# Patient Record
Sex: Female | Born: 1990
Health system: Southern US, Community
[De-identification: ages and names within clinical notes are randomized; demographics above are authoritative.]

## PROBLEM LIST (undated history)

## (undated) DIAGNOSIS — J45909 Unspecified asthma, uncomplicated: Secondary | ICD-10-CM

## (undated) HISTORY — DX: Unspecified asthma, uncomplicated: J45.909

## (undated) HISTORY — PX: APPENDECTOMY: SHX54

---

## 2016-12-21 ENCOUNTER — Encounter: Payer: Self-pay | Admitting: Certified Nurse Midwife

## 2016-12-21 ENCOUNTER — Ambulatory Visit (INDEPENDENT_AMBULATORY_CARE_PROVIDER_SITE_OTHER): Payer: BLUE CROSS/BLUE SHIELD | Admitting: Certified Nurse Midwife

## 2016-12-21 VITALS — BP 122/92 | HR 98 | Ht 65.0 in | Wt 242.4 lb

## 2016-12-21 DIAGNOSIS — R635 Abnormal weight gain: Secondary | ICD-10-CM | POA: Diagnosis not present

## 2016-12-21 NOTE — Progress Notes (Signed)
GYN ENCOUNTER NOTE  Subjective:       Mallory English is a 26 y.o. G10P1001 female is here for gynecologic evaluation of the following issues:  1. Weight gain of 28 lbs since getting nexplanon in. She is contemplating having her nexplanon removed . She has been dieting " clean foods" and exercising 3-4 days a wk for 1hr to 1hr 15 min. For several months without weight loss. She occasional eats snacks of a smoothly consisting of fruit. She denies excess empty calories ( chips, cracker, sweets).  She denies hx of PCOS. She no cycle because of the nexplanon. She has been overall happy with the method except for weight gain since having it in place.     Gynecologic History Patient's last menstrual period was 10/30/2014. Contraception: Nexplanon Last Pap: 2017. Results were: normal per pt.  Last mammogram: N/A  Obstetric History OB History  Gravida Para Term Preterm AB Living  SAB TAB Ectopic Multiple Live Births          1    # Outcome Date GA Lbr Len/2nd Weight Sex Delivery Anes PTL Lv  1 Term 08/16/15 [redacted]w[redacted]d  8 lb 5 oz (3.771 kg) M Vag-Spont   LIV      Past Medical History:  Diagnosis Date  . Asthma     Past Surgical History:  Procedure Laterality Date  . APPENDECTOMY      No current outpatient prescriptions on file prior to visit.   No current facility-administered medications on file prior to visit.     No Known Allergies  Social History   Social History  . Marital status: Married    Spouse name: N/A  . Number of children: N/A  . Years of education: N/A   Occupational History  . Not on file.   Social History Main Topics  . Smoking status: Never Smoker  . Smokeless tobacco: Never Used  . Alcohol use Yes     Comment: Rarely   . Drug use: No  . Sexual activity: Yes    Birth control/ protection: Implant   Other Topics Concern  . Not on file   Social History Narrative  . No narrative on file    Family History  Problem Relation Age of Onset  .  Heart defect Mother     The following portions of the patient's history were reviewed and updated as appropriate: allergies, current medications, past family history, past medical history, past social history, past surgical history and problem list.  Review of Systems Review of Systems - Negative except as mentioned in HPI Review of Systems - General ROS: negative for - chills, fatigue, fever, hot flashes, malaise or night sweats Hematological and Lymphatic ROS: negative for - bleeding problems or swollen lymph nodes Gastrointestinal ROS: negative for - abdominal pain, blood in stools, change in bowel habits and nausea/vomiting Musculoskeletal ROS: negative for - joint pain, muscle pain or muscular weakness Genito-Urinary ROS: negative for - change in menstrual cycle, dysmenorrhea, dyspareunia, dysuria, genital discharge, genital ulcers, hematuria, incontinence, irregular/heavy menses, nocturia or pelvic pain  Objective:   BP (!) 122/92 (BP Location: Left Arm, Patient Position: Sitting, Cuff Size: Large)   Pulse 98   Ht  (1.651 m)   Wt 242 lb 6.4 oz (110 kg)   LMP 10/30/2014   Breastfeeding? No   BMI 40.34 kg/m  CONSTITUTIONAL: Well-developed, well-nourished female in no acute distress.  HENT:  Normocephalic, atraumatic.  NECK: Normal  range of motion, supple SKIN: Skin is warm and dry. No rash noted. Not diaphoretic. No erythema. No pallor. NEUROLGIC: Alert and oriented to person, place, and time.  PSYCHIATRIC: Normal mood and affect. Normal behavior. Normal judgment and thought content. CARDIOVASCULAR:Not Examined RESPIRATORY: Not Examined BREASTS: Not Examined ABDOMEN: Soft, non distended; Non tender.  No Organomegaly. PELVIC: not indicated MUSCULOSKELETAL: Normal range of motion. No tenderness.  No cyanosis, clubbing, or edema.   Assessment:   1. Weight gain  - TSH     Plan:   Will follow up with lab result, schedule nexplanon removal at earliest convince.  Talked about use of medications to assist with weight loss.   I attest that more than 50% of this visit was review of history, discussion of birth control option, discussion of her diet & exercise plan and discussion of medications for weight loss. We also discussed the removal procedure for the nexplanon.    Doreene BurkeAnnie Verita Kuroda, CNM

## 2016-12-21 NOTE — Patient Instructions (Signed)

## 2016-12-22 LAB — TSH: TSH: 1.45 u[IU]/mL (ref 0.450–4.500)

## 2016-12-24 ENCOUNTER — Telehealth: Payer: Self-pay | Admitting: Certified Nurse Midwife

## 2016-12-24 NOTE — Telephone Encounter (Signed)
Called to review labs, no answer. Left message.  Doreene BurkeAnnie Marilynn Ekstein, CNM

## 2016-12-24 NOTE — Telephone Encounter (Signed)
Patient called and stated that she missed a call from Doreene BurkeAnnie English, The patient would like a call back. No other information was disclosed. Please advise.

## 2016-12-24 NOTE — Telephone Encounter (Signed)
Reviewed TSH results with Mallory English. Answered her questions.   Mallory BurkeAnnie Javeion English, CNM

## 2016-12-24 NOTE — Telephone Encounter (Signed)
Patient called stating that she just missed a call from [facility] in regards to her Results. The patient would like a call back when Pattricia Bossnnie is available. No other information was disclosed. Please advise.

## 2016-12-26 ENCOUNTER — Ambulatory Visit (INDEPENDENT_AMBULATORY_CARE_PROVIDER_SITE_OTHER): Payer: BLUE CROSS/BLUE SHIELD | Admitting: Certified Nurse Midwife

## 2016-12-26 ENCOUNTER — Encounter: Payer: Self-pay | Admitting: Certified Nurse Midwife

## 2016-12-26 VITALS — BP 102/80 | HR 70 | Wt 239.4 lb

## 2016-12-26 DIAGNOSIS — Z3046 Encounter for surveillance of implantable subdermal contraceptive: Secondary | ICD-10-CM | POA: Diagnosis not present

## 2016-12-26 NOTE — Progress Notes (Signed)
Sima MatasJulia Dimmer is a 26 y.o. year old 71P1001 Caucasian female here for Nexplanon removal.  She was given informed consent for removal of her Nexplanon. Her Nexplanon was placed 15 months ago. She would like it removed to to weight gain.   No LMP recorded. Patient has had an implant..    BP 102/80   Pulse 70   Wt 239 lb 6.4 oz (108.6 kg)   BMI 39.84 kg/m  No LMP recorded. Patient has had an implant. No results found for this or any previous visit (from the past 24 hour(s)).   Appropriate time out taken. Nexplanon site identified.  Area prepped in usual sterile fashon. Two cc's of 2% lidocaine was used to anesthetize the area. A small stab incision was made right beside the implant on the distal portion.  The Nexplanon rod was grasped using hemostats and removed intact without difficulty.  Steri-strips and a pressure bandage was applied.  There was less than 3 cc blood loss. There were no complications.  The patient tolerated the procedure well.  She was instructed to keep the area clean and dry, remove pressure bandage in 24 hours, and keep insertion site covered with the steri-strips for 3-5 days.  She will use condoms for birth control. Follow-up PRN problems.  Doreene BurkeAnnie Kem Parcher, CNM

## 2016-12-26 NOTE — Patient Instructions (Signed)
Nexplanon Instructions After Removal  Keep bandage clean and dry for 24 hours  May use ice/Tylenol/Ibuprofen for soreness or pain  If you develop fever, drainage or increased warmth from incision site-contact office immediately   

## 2017-03-11 ENCOUNTER — Other Ambulatory Visit: Payer: Self-pay

## 2017-03-11 ENCOUNTER — Emergency Department
Admission: EM | Admit: 2017-03-11 | Discharge: 2017-03-11 | Disposition: A | Discharge status: Home / Self Care | Payer: BLUE CROSS/BLUE SHIELD | Attending: Emergency Medicine | Admitting: Emergency Medicine

## 2017-03-11 ENCOUNTER — Encounter: Payer: Self-pay | Admitting: Emergency Medicine

## 2017-03-11 DIAGNOSIS — K0889 Other specified disorders of teeth and supporting structures: Secondary | ICD-10-CM

## 2017-03-11 DIAGNOSIS — Z79899 Other long term (current) drug therapy: Secondary | ICD-10-CM | POA: Insufficient documentation

## 2017-03-11 DIAGNOSIS — K047 Periapical abscess without sinus: Secondary | ICD-10-CM | POA: Diagnosis not present

## 2017-03-11 DIAGNOSIS — K029 Dental caries, unspecified: Secondary | ICD-10-CM | POA: Insufficient documentation

## 2017-03-11 DIAGNOSIS — J45909 Unspecified asthma, uncomplicated: Secondary | ICD-10-CM | POA: Diagnosis not present

## 2017-03-11 MED ORDER — AMOXICILLIN 500 MG PO CAPS
500.0000 mg | ORAL_CAPSULE | Freq: Once | ORAL | Status: AC
  Administered 2017-03-11: 500 mg via ORAL
  Filled 2017-03-11: qty 1

## 2017-03-11 MED ORDER — HYDROCODONE-ACETAMINOPHEN 5-325 MG PO TABS: 1.0000 | ORAL_TABLET | Freq: Four times a day (QID) | ORAL | 0 refills | Status: DC | PRN

## 2017-03-11 MED ORDER — AMOXICILLIN 500 MG PO CAPS: 500.0000 mg | ORAL_CAPSULE | Freq: Three times a day (TID) | ORAL | 0 refills | Status: DC

## 2017-03-11 MED ORDER — HYDROCODONE-ACETAMINOPHEN 5-325 MG PO TABS
1.0000 | ORAL_TABLET | Freq: Once | ORAL | Status: AC
  Administered 2017-03-11: 1 via ORAL
  Filled 2017-03-11: qty 1

## 2017-03-11 MED ORDER — IBUPROFEN 800 MG PO TABS: 800.0000 mg | ORAL_TABLET | Freq: Three times a day (TID) | ORAL | 0 refills | Status: DC | PRN

## 2017-03-11 MED ORDER — LIDOCAINE VISCOUS 2 % MT SOLN
15.0000 mL | Freq: Once | OROMUCOSAL | Status: AC
  Administered 2017-03-11: 15 mL via OROMUCOSAL
  Filled 2017-03-11: qty 15

## 2017-03-11 NOTE — Discharge Instructions (Signed)
1.  Take antibiotic as prescribed (amoxicillin 500 mg 3 times daily x7 days). °2.  You may take pain medicines as needed (Motrin/Norco #15). °3.  Return to the ER for worsening symptoms, persistent vomiting, difficulty breathing or other concerns. °

## 2017-03-11 NOTE — ED Provider Notes (Signed)
[facility] Emergency Department Provider Note   ____________________________________________   First MD Initiated Contact with Patient 03/11/17 0533     (approximate)  I have reviewed the triage vital signs and the nursing notes.   HISTORY  Chief Complaint Dental Pain    HPI Mallory English is a 26 y.o. female who presents to the ED from home with a chief complaint of toothache.  Patient states "I have bad teeth", having intermittent pain to her left side for the past month.  Awoke around 1 AM this morning with pain to her left lower jaw.  Denies associated swelling, fever, chills, abdominal pain, nausea or vomiting.  Denies recent trauma.  Took ibuprofen without relief of symptoms.   Past Medical History:  Diagnosis Date  . Asthma     There are no active problems to display for this patient.   Past Surgical History:  Procedure Laterality Date  . APPENDECTOMY      Prior to Admission medications   Medication Sig Start Date End Date Taking? Authorizing Provider  albuterol (PROVENTIL HFA;VENTOLIN HFA) 108 (90 Base) MCG/ACT inhaler Inhale 2 puffs into the lungs every 4 (four) hours as needed for wheezing or shortness of breath.   Yes [provider]  amoxicillin (AMOXIL) 500 MG capsule Take 1 capsule (500 mg total) by mouth 3 (three) times daily. 03/11/17   Irean HongSung, Liem Copenhaver J, MD  HYDROcodone-acetaminophen (NORCO) 5-325 MG tablet Take 1 tablet by mouth every 6 (six) hours as needed for moderate pain. 03/11/17   Irean HongSung, Edita Weyenberg J, MD  ibuprofen (ADVIL,MOTRIN) 800 MG tablet Take 1 tablet (800 mg total) by mouth every 8 (eight) hours as needed for moderate pain. 03/11/17   Irean HongSung, Alixandrea Milleson J, MD    Allergies Patient has no known allergies.  Family History  Problem Relation Age of Onset  . Heart defect Mother     Social History Social History   Tobacco Use  . Smoking status: Never Smoker  . Smokeless tobacco: Never Used  Substance Use Topics  . Alcohol  use: Yes    Comment: Rarely   . Drug use: No    Review of Systems  Constitutional: No fever/chills. Eyes: No visual changes. ENT: Positive for toothache.  No sore throat. Cardiovascular: Denies chest pain. Respiratory: Denies shortness of breath. Gastrointestinal: No abdominal pain.  No nausea, no vomiting.  No diarrhea.  No constipation. Genitourinary: Negative for dysuria. Musculoskeletal: Negative for back pain. Skin: Negative for rash. Neurological: Negative for headaches, focal weakness or numbness.   ____________________________________________   PHYSICAL EXAM:  VITAL SIGNS: ED Triage Vitals  Enc Vitals Group     BP 03/11/17 0510 120/71     Pulse Rate 03/11/17 0510 87     Resp 03/11/17 0510 17     Temp 03/11/17 0510 98.4 F (36.9 C)     Temp Source 03/11/17 0510 Oral     SpO2 03/11/17 0510 99 %     Weight 03/11/17 0508 225 lb (102.1 kg)     Height 03/11/17 0508 5\' 5"  (1.651 m)     Head Circumference --      Peak Flow --      Pain Score 03/11/17 0508 7     Pain Loc --      Pain Edu? --      Excl. in GC? --     Constitutional: Alert and oriented. Well appearing and in no acute distress. Eyes: Conjunctivae are normal. PERRL. EOMI. Head: Atraumatic. Nose: No  congestion/rhinnorhea. Mouth/Throat: Widespread dental caries.  Left posterior molar with pre-existing chipped tooth.  Tender to palpation with tongue blade.  No intra-or extraoral swelling. Neck: No stridor.   Cardiovascular: Normal rate, regular rhythm. Grossly normal heart sounds.  Good peripheral circulation. Respiratory: Normal respiratory effort.  No retractions. Lungs CTAB. Gastrointestinal: Soft and nontender. No distention. No abdominal bruits. No CVA tenderness. Musculoskeletal: No lower extremity tenderness nor edema.  No joint effusions. Neurologic:  Normal speech and language. No gross focal neurologic deficits are appreciated. No gait instability. Skin:  Skin is warm, dry and intact. No rash  noted. Psychiatric: Mood and affect are normal. Speech and behavior are normal.  ____________________________________________   LABS (all labs ordered are listed, but only abnormal results are displayed)  Labs Reviewed - No data to display ____________________________________________  EKG  None ____________________________________________  RADIOLOGY  No results found.  ____________________________________________   PROCEDURES  Procedure(s) performed: None  Procedures  Critical Care performed: No  ____________________________________________   INITIAL IMPRESSION / ASSESSMENT AND PLAN / ED COURSE  As part of my medical decision making, I reviewed the following data within the electronic MEDICAL RECORD NUMBER Nursing notes reviewed and incorporated and Notes from prior ED visits.   26 year old female who presents with dentalgia secondary to dental infection.  Will start amoxicillin, analgesia and referred to dental clinic for follow-up.  Strict return precautions given.  Patient and spouse verbalized understanding and agree with plan of care.      ____________________________________________   FINAL CLINICAL IMPRESSION(S) / ED DIAGNOSES  Final diagnoses:  Pain, dental  Dental caries  Dental infection     ED Discharge Orders        Ordered    amoxicillin (AMOXIL) 500 MG capsule  3 times daily     03/11/17 0544    ibuprofen (ADVIL,MOTRIN) 800 MG tablet  Every 8 hours PRN     03/11/17 0544    HYDROcodone-acetaminophen (NORCO) 5-325 MG tablet  Every 6 hours PRN     03/11/17 0544       Note:  This document was prepared using Dragon voice recognition software and may include unintentional dictation errors.    Irean HongSung, Tylyn Derwin J, MD 03/11/17 [phone]

## 2017-03-11 NOTE — ED Triage Notes (Signed)
Pt says "I have bad teeth"; woke around 1 am with pain to the left side of her mouth, unable to tell if it's top or bottom as the whole side hurts; took ibuprofen with no relief;

## 2018-01-06 ENCOUNTER — Ambulatory Visit: Payer: BLUE CROSS/BLUE SHIELD | Admitting: Dietician

## 2018-01-13 ENCOUNTER — Encounter: Payer: Self-pay | Admitting: Dietician

## 2018-01-13 NOTE — Progress Notes (Signed)
Have not heard back from patient to reschedule her missed appointment from 01/06/18. Sent letter to referring provider.

## 2018-02-13 DIAGNOSIS — F419 Anxiety disorder, unspecified: Secondary | ICD-10-CM | POA: Insufficient documentation

## 2018-02-13 DIAGNOSIS — J4520 Mild intermittent asthma, uncomplicated: Secondary | ICD-10-CM | POA: Insufficient documentation

## 2018-02-13 DIAGNOSIS — E66813 Obesity, class 3: Secondary | ICD-10-CM | POA: Insufficient documentation

## 2018-02-24 ENCOUNTER — Encounter: Payer: BLUE CROSS/BLUE SHIELD | Admitting: Certified Nurse Midwife

## 2018-10-13 ENCOUNTER — Telehealth: Payer: Self-pay

## 2018-10-13 NOTE — Telephone Encounter (Signed)
Coronavirus (COVID-19) Are you at risk?  Are you at risk for the Coronavirus (COVID-19)?  To be considered HIGH RISK for Coronavirus (COVID-19), you have to meet the following criteria:  . Traveled to [place], [place], [place], [place] or [place]; or in the [place] to [place], [place], [place], or [place]; and have fever, cough, and shortness of breath within the last 2 weeks of travel OR . Been in close contact with a person diagnosed with COVID-19 within the last 2 weeks and have fever, cough, and shortness of breath . IF YOU DO NOT MEET THESE CRITERIA, YOU ARE CONSIDERED LOW RISK FOR COVID-19.  What to do if you are HIGH RISK for COVID-19?  . If you are having a medical emergency, call 911. . Seek medical care right away. Before you go to a doctor's office, urgent care or emergency department, call ahead and tell them about your recent travel, contact with someone diagnosed with COVID-19, and your symptoms. You should receive instructions from your physician's office regarding next steps of care.  . When you arrive at healthcare provider, tell the healthcare staff immediately you have returned from visiting [place], [place], [place], [place] or [place]; or traveled in the [place] to [place], [place], [place], or [place]; in the last two weeks or you have been in close contact with a person diagnosed with COVID-19 in the last 2 weeks.   . Tell the health care staff about your symptoms: fever, cough and shortness of breath. . After you have been seen by a medical provider, you will be either: o Tested for (COVID-19) and discharged home on quarantine except to seek medical care if symptoms worsen, and asked to  - Stay home and avoid contact with others until you get your results (4-5 days)  - Avoid travel on public transportation if possible (such as bus, train, or airplane) or o Sent to the Emergency Department by EMS for evaluation, COVID-19 testing, and possible  admission depending on your condition and test results.  What to do if you are LOW RISK for COVID-19?  Reduce your risk of any infection by using the same precautions used for avoiding the common cold or flu:  . Wash your hands often with soap and warm water for at least 20 seconds.  If soap and water are not readily available, use an alcohol-based hand sanitizer with at least 60% alcohol.  . If coughing or sneezing, cover your mouth and nose by coughing or sneezing into the elbow areas of your shirt or coat, into a tissue or into your sleeve (not your hands). . Avoid shaking hands with others and consider head nods or verbal greetings only. . Avoid touching your eyes, nose, or mouth with unwashed hands.  . Avoid close contact with people who are Reily Treloar. . Avoid places or events with large numbers of people in one location, like concerts or sporting events. . Carefully consider travel plans you have or are making. . If you are planning any travel outside or inside the [place], visit the CDC's Travelers' Health webpage for the latest health notices. . If you have some symptoms but not all symptoms, continue to monitor at home and seek medical attention if your symptoms worsen. . If you are having a medical emergency, call 911.  10/13/18 SCREENING NEG SLS ADDITIONAL HEALTHCARE OPTIONS FOR PATIENTS  [facility] / e-Visit: [email or web address]         [facility]: [phone]    [facility]: [phone]                   [facility]: [phone]  

## 2018-10-14 ENCOUNTER — Encounter: Payer: Self-pay | Admitting: Certified Nurse Midwife

## 2018-10-14 ENCOUNTER — Ambulatory Visit (INDEPENDENT_AMBULATORY_CARE_PROVIDER_SITE_OTHER): Payer: 59 | Admitting: Certified Nurse Midwife

## 2018-10-14 ENCOUNTER — Other Ambulatory Visit: Payer: Self-pay

## 2018-10-14 VITALS — BP 103/73 | HR 96 | Ht 65.0 in | Wt 265.2 lb

## 2018-10-14 DIAGNOSIS — Z3201 Encounter for pregnancy test, result positive: Secondary | ICD-10-CM

## 2018-10-14 DIAGNOSIS — N912 Amenorrhea, unspecified: Secondary | ICD-10-CM

## 2018-10-14 LAB — POCT URINE PREGNANCY: Preg Test, Ur: POSITIVE — AB

## 2018-10-14 NOTE — Patient Instructions (Signed)

## 2018-10-14 NOTE — Progress Notes (Signed)
Subjective:    Mallory English is a 28 y.o. female who presents for evaluation of amenorrhea. She believes she could be pregnant. Pregnancy is desired. Sexual Activity: single partner, contraception: none. Current symptoms also include: fatigue and nausea. Last period was normal.   Patient's last menstrual period was 09/01/2018 (exact date). The following portions of the patient's history were reviewed and updated as appropriate: allergies, current medications, past family history, past medical history, past social history, past surgical history and problem list.  Review of Systems Pertinent items are noted in HPI.     Objective:    BP 103/73   Pulse 96   Ht 5\' 5"  (1.651 m)   Wt 265 lb 3 oz (120.3 kg)   LMP 09/01/2018 (Exact Date)   BMI 44.13 kg/m  General: alert, cooperative, appears stated age and no acute distress    Lab Review Urine HCG: positive    Assessment:    Absence of menstruation.     Plan:   Positive: EDC: 06/08/19 Briefly discussed pre-natal care options MD vs Midwifery care. Pt request to see Midwives.  Encouraged well-balanced diet, plenty of rest when needed, pre-natal vitamins daily and walking for exercise. Discussed self-help for nausea, avoiding OTC medications until consulting provider or pharmacist, other than Tylenol as needed, minimal caffeine (1-2 cups daily) and avoiding alcohol. She will schedule her dating u/s in 2 wks, her nurse visit @ 10 wks and her  initial NOB visit @ 12 wks.   Philip Aspen, CNM

## 2018-10-28 ENCOUNTER — Other Ambulatory Visit: Payer: 59

## 2018-10-29 ENCOUNTER — Other Ambulatory Visit: Payer: Self-pay

## 2018-10-29 ENCOUNTER — Ambulatory Visit (INDEPENDENT_AMBULATORY_CARE_PROVIDER_SITE_OTHER): Payer: 59

## 2018-10-29 DIAGNOSIS — Z3687 Encounter for antenatal screening for uncertain dates: Secondary | ICD-10-CM | POA: Diagnosis not present

## 2018-10-29 DIAGNOSIS — N912 Amenorrhea, unspecified: Secondary | ICD-10-CM

## 2018-11-10 ENCOUNTER — Other Ambulatory Visit: Payer: Self-pay

## 2018-11-10 ENCOUNTER — Ambulatory Visit: Payer: 59 | Admitting: Certified Nurse Midwife

## 2018-11-10 VITALS — BP 118/64 | HR 80 | Ht 65.0 in | Wt 262.0 lb

## 2018-11-10 DIAGNOSIS — Z113 Encounter for screening for infections with a predominantly sexual mode of transmission: Secondary | ICD-10-CM

## 2018-11-10 DIAGNOSIS — Z3481 Encounter for supervision of other normal pregnancy, first trimester: Secondary | ICD-10-CM

## 2018-11-10 DIAGNOSIS — Z0283 Encounter for blood-alcohol and blood-drug test: Secondary | ICD-10-CM

## 2018-11-10 DIAGNOSIS — R638 Other symptoms and signs concerning food and fluid intake: Secondary | ICD-10-CM

## 2018-11-10 LAB — OB RESULTS CONSOLE VARICELLA ZOSTER ANTIBODY, IGG: Varicella: IMMUNE

## 2018-11-10 NOTE — Progress Notes (Signed)
      Mallory English presents for NOB nurse intake visit. Pregnancy confirmation done at [facility],10/14/18, with Philip Aspen.  G2.  P1001.  LMPUnknown.  EDD3/1/20 by ultrasound.  [place]. Pregnancy education material explained and given.  2 cats in the home.  NOB labs ordered. BMI greater than 30. TSH/HbgA1c ordered. Sickle cell not order due to race. HIV and drug screen explained and ordered/declined. Genetic screening discussed. Genetic testing; Ordered/Declined/Unsure. Pt to discuss genetic testing with provider. PNV encouraged. Pt to follow up with provider in 2 weeks for NOB physical. [facility] financial policy and FMLA forms reviewed and signed. Pt verbal stated understanding.    BP 118/64   Pulse 80   Ht 5\' 5"  (1.651 m)   Wt 262 lb (118.8 kg)   LMP 09/01/2018 (Exact Date)   BMI 43.60 kg/m

## 2018-11-10 NOTE — Patient Instructions (Addendum)
WHAT OB PATIENTS CAN EXPECT   Confirmation of pregnancy and ultrasound ordered if medically indicated-[redacted] weeks gestation  New OB (NOB) intake with nurse and New OB (NOB) labs- [redacted] weeks gestation  New OB (NOB) physical examination with provider- 11/[redacted] weeks gestation  Flu vaccine-[redacted] weeks gestation  Anatomy scan-[redacted] weeks gestation  Glucose tolerance test, blood work to test for anemia, T-dap vaccine-[redacted] weeks gestation  Vaginal swabs/cultures-STD/Group B strep-[redacted] weeks gestation  Appointments every 4 weeks until 28 weeks  Every 2 weeks from 28 weeks until 36 weeks  Weekly visits from 36 weeks until delivery  Second Trimester of Pregnancy  The second trimester is from week 14 through week 27 (month 4 through 6). This is often the time in pregnancy that you feel your best. Often times, morning sickness has lessened or quit. You may have more energy, and you may get hungry more often. Your unborn baby is growing rapidly. At the end of the sixth month, he or she is about 9 inches long and weighs about 1 pounds. You will likely feel the baby move between 18 and 20 weeks of pregnancy. Follow these instructions at home: Medicines  Take over-the-counter and prescription medicines only as told by your doctor. Some medicines are safe and some medicines are not safe during pregnancy.  Take a prenatal vitamin that contains at least 600 micrograms (mcg) of folic acid.  If you have trouble pooping (constipation), take medicine that will make your stool soft (stool softener) if your doctor approves. Eating and drinking   Eat regular, healthy meals.  Avoid raw meat and uncooked cheese.  If you get low calcium from the food you eat, talk to your doctor about taking a daily calcium supplement.  Avoid foods that are high in fat and sugars, such as fried and sweet foods.  If you feel sick to your stomach (nauseous) or throw up (vomit): ? Eat 4 or 5 small meals a day instead of 3 large  meals. ? Try eating a few soda crackers. ? Drink liquids between meals instead of during meals.  To prevent constipation: ? Eat foods that are high in fiber, like fresh fruits and vegetables, whole grains, and beans. ? Drink enough fluids to keep your pee (urine) clear or pale yellow. Activity  Exercise only as told by your doctor. Stop exercising if you start to have cramps.  Do not exercise if it is too hot, too humid, or if you are in a place of great height (high altitude).  Avoid heavy lifting.  Wear low-heeled shoes. Sit and stand up straight.  You can continue to have sex unless your doctor tells you not to. Relieving pain and discomfort  Wear a good support bra if your breasts are tender.  Take warm water baths (sitz baths) to soothe pain or discomfort caused by hemorrhoids. Use hemorrhoid cream if your doctor approves.  Rest with your legs raised if you have leg cramps or low back pain.  If you develop puffy, bulging veins (varicose veins) in your legs: ? Wear support hose or compression stockings as told by your doctor. ? Raise (elevate) your feet for 15 minutes, 3-4 times a day. ? Limit salt in your food. Prenatal care  Write down your questions. Take them to your prenatal visits.  Keep all your prenatal visits as told by your doctor. This is important. Safety  Wear your seat belt when driving.  Make a list of emergency phone numbers, including numbers for family, friends, the   hospital, and police and fire departments. General instructions  Ask your doctor about the right foods to eat or for help finding a counselor, if you need these services.  Ask your doctor about local prenatal classes. Begin classes before month 6 of your pregnancy.  Do not use hot tubs, steam rooms, or saunas.  Do not douche or use tampons or scented sanitary pads.  Do not cross your legs for long periods of time.  Visit your dentist if you have not done so. Use a soft toothbrush  to brush your teeth. Floss gently.  Avoid all smoking, herbs, and alcohol. Avoid drugs that are not approved by your doctor.  Do not use any products that contain nicotine or tobacco, such as cigarettes and e-cigarettes. If you need help quitting, ask your doctor.  Avoid cat litter boxes and soil used by cats. These carry germs that can cause birth defects in the baby and can cause a loss of your baby (miscarriage) or stillbirth. Contact a doctor if:  You have mild cramps or pressure in your lower belly.  You have pain when you pee (urinate).  You have bad smelling fluid coming from your vagina.  You continue to feel sick to your stomach (nauseous), throw up (vomit), or have watery poop (diarrhea).  You have a nagging pain in your belly area.  You feel dizzy. Get help right away if:  You have a fever.  You are leaking fluid from your vagina.  You have spotting or bleeding from your vagina.  You have severe belly cramping or pain.  You lose or gain weight rapidly.  You have trouble catching your breath and have chest pain.  You notice sudden or extreme puffiness (swelling) of your face, hands, ankles, feet, or legs.  You have not felt the baby move in over an hour.  You have severe headaches that do not go away when you take medicine.  You have trouble seeing. Summary  The second trimester is from week 14 through week 27 (months 4 through 6). This is often the time in pregnancy that you feel your best.  To take care of yourself and your unborn baby, you will need to eat healthy meals, take medicines only if your doctor tells you to do so, and do activities that are safe for you and your baby.  Call your doctor if you get sick or if you notice anything unusual about your pregnancy. Also, call your doctor if you need help with the right food to eat, or if you want to know what activities are safe for you. This information is not intended to replace advice given to you by  your health care provider. Make sure you discuss any questions you have with your health care provider. Document Released: 06/27/2009 Document Revised: 07/25/2018 Document Reviewed: 05/08/2016 Elsevier Patient Education  [place]. Morning Sickness  Morning sickness is when you feel sick to your stomach (nauseous) during pregnancy. You may feel sick to your stomach and throw up (vomit). You may feel sick in the morning, but you can feel this way at any time of day. Some women feel very sick to their stomach and cannot stop throwing up (hyperemesis gravidarum). Follow these instructions at home: Medicines  Take over-the-counter and prescription medicines only as told by your doctor. Do not take any medicines until you talk with your doctor about them first.  Taking multivitamins before getting pregnant can stop or lessen the harshness of morning sickness. Eating and  drinking  Eat dry toast or crackers before getting out of bed.  Eat 5 or 6 small meals a day.  Eat dry and bland foods like rice and baked potatoes.  Do not eat greasy, fatty, or spicy foods.  Have someone cook for you if the smell of food causes you to feel sick or throw up.  If you feel sick to your stomach after taking prenatal vitamins, take them at night or with a snack.  Eat protein when you need a snack. Nuts, yogurt, and cheese are good choices.  Drink fluids throughout the day.  Try ginger ale made with real ginger, ginger tea made from fresh grated ginger, or ginger candies. General instructions  Do not use any products that have nicotine or tobacco in them, such as cigarettes and e-cigarettes. If you need help quitting, ask your doctor.  Use an air purifier to keep the air in your house free of smells.  Get lots of fresh air.  Try to avoid smells that make you feel sick.  Try: ? Wearing a bracelet that is used for seasickness (acupressure wristband). ? Going to a doctor who puts thin needles  into certain body points (acupuncture) to improve how you feel. Contact a doctor if:  You need medicine to feel better.  You feel dizzy or light-headed.  You are losing weight. Get help right away if:  You feel very sick to your stomach and cannot stop throwing up.  You pass out (faint).  You have very bad pain in your belly. Summary  Morning sickness is when you feel sick to your stomach (nauseous) during pregnancy.  You may feel sick in the morning, but you can feel this way at any time of day.  Making some changes to what you eat may help your symptoms go away. This information is not intended to replace advice given to you by your health care provider. Make sure you discuss any questions you have with your health care provider. Document Released: 05/10/2004 Document Revised: 03/15/2017 Document Reviewed: 05/03/2016 Elsevier Patient Education  2020 [employer]. How a Baby Grows During Pregnancy  Pregnancy begins when a female's sperm enters a female's egg (fertilization). Fertilization usually happens in one of the tubes (fallopian tubes) that connect the ovaries to the womb (uterus). The fertilized egg moves down the fallopian tube to the uterus. Once it reaches the uterus, it implants into the lining of the uterus and begins to grow. For the first 10 weeks, the fertilized egg is called an embryo. After 10 weeks, it is called a fetus. As the fetus continues to grow, it receives oxygen and nutrients through tissue (placenta) that grows to support the developing baby. The placenta is the life support system for the baby. It provides oxygen and nutrition and removes waste. Learning as much as you can about your pregnancy and how your baby is developing can help you enjoy the experience. It can also make you aware of when there might be a problem and when to ask questions. How long does a typical pregnancy last? A pregnancy usually lasts 280 days, or about 40 weeks. Pregnancy is  divided into three periods of growth, also called trimesters:  First trimester: 0-12 weeks.  Second trimester: 13-27 weeks.  Third trimester: 28-40 weeks. The day when your baby is ready to be born (full term) is your estimated date of delivery. How does my baby develop month by month? First month  The fertilized egg attaches to the inside  of the uterus.  Some cells will form the placenta. Others will form the fetus.  The arms, legs, brain, spinal cord, lungs, and heart begin to develop.  At the end of the first month, the heart begins to beat. Second month  The bones, inner ear, eyelids, hands, and feet form.  The genitals develop.  By the end of 8 weeks, all major organs are developing. Third month  All of the internal organs are forming.  Teeth develop below the gums.  Bones and muscles begin to grow. The spine can flex.  The skin is transparent.  Fingernails and toenails begin to form.  Arms and legs continue to grow longer, and hands and feet develop.  The fetus is about 3 inches (7.6 cm) long. Fourth month  The placenta is completely formed.  The external sex organs, neck, outer ear, eyebrows, eyelids, and fingernails are formed.  The fetus can hear, swallow, and move its arms and legs.  The kidneys begin to produce urine.  The skin is covered with a white, waxy coating (vernix) and very fine hair (lanugo). Fifth month  The fetus moves around more and can be felt for the first time (quickening).  The fetus starts to sleep and wake up and may begin to suck its finger.  The nails grow to the end of the fingers.  The organ in the digestive system that makes bile (gallbladder) functions and helps to digest nutrients.  If your baby is a girl, eggs are present in her ovaries. If your baby is a boy, testicles start to move down into his scrotum. Sixth month  The lungs are formed.  The eyes open. The brain continues to develop.  Your baby has  fingerprints and toe prints. Your baby's hair grows thicker.  At the end of the second trimester, the fetus is about 9 inches (22.9 cm) long. Seventh month  The fetus kicks and stretches.  The eyes are developed enough to sense changes in light.  The hands can make a grasping motion.  The fetus responds to sound. Eighth month  All organs and body systems are fully developed and functioning.  Bones harden, and taste buds develop. The fetus may hiccup.  Certain areas of the brain are still developing. The skull remains soft. Ninth month  The fetus gains about  lb (0.23 kg) each week.  The lungs are fully developed.  Patterns of sleep develop.  The fetus's head typically moves into a head-down position (vertex) in the uterus to prepare for birth.  The fetus weighs 6-9 lb (2.72-4.08 kg) and is 19-20 inches (48.26-50.8 cm) long. What can I do to have a healthy pregnancy and help my baby develop? General instructions  Take prenatal vitamins as directed by your health care provider. These include vitamins such as folic acid, iron, calcium, and vitamin D. They are important for healthy development.  Take medicines only as directed by your health care provider. Read labels and ask a pharmacist or your health care provider whether over-the-counter medicines, supplements, and prescription drugs are safe to take during pregnancy.  Keep all follow-up visits as directed by your health care provider. This is important. Follow-up visits include prenatal care and screening tests. How do I know if my baby is developing well? At each prenatal visit, your health care provider will do several different tests to check on your health and keep track of your baby's development. These include:  Fundal height and position. ? Your health care provider will  measure your growing belly from your pubic bone to the top of the uterus using a tape measure. ? Your health care provider will also feel your  belly to determine your baby's position.  Heartbeat. ? An ultrasound in the first trimester can confirm pregnancy and show a heartbeat, depending on how far along you are. ? Your health care provider will check your baby's heart rate at every prenatal visit.  Second trimester ultrasound. ? This ultrasound checks your baby's development. It also may show your baby's gender. What should I do if I have concerns about my baby's development? Always talk with your health care provider about any concerns that you may have about your pregnancy and your baby. Summary  A pregnancy usually lasts 280 days, or about 40 weeks. Pregnancy is divided into three periods of growth, also called trimesters.  Your health care provider will monitor your baby's growth and development throughout your pregnancy.  Follow your health care provider's recommendations about taking prenatal vitamins and medicines during your pregnancy.  Talk with your health care provider if you have any concerns about your pregnancy or your developing baby. This information is not intended to replace advice given to you by your health care provider. Make sure you discuss any questions you have with your health care provider. Document Released: 09/19/2007 Document Revised: 07/24/2018 Document Reviewed: 02/13/2017 Elsevier Patient Education  2020 [place] of Pregnancy  The first trimester of pregnancy is from week 1 until the end of week 13 (months 1 through 3). During this time, your baby will begin to develop inside you. At 6-8 weeks, the eyes and face are formed, and the heartbeat can be seen on ultrasound. At the end of 12 weeks, all the baby's organs are formed. Prenatal care is all the medical care you receive before the birth of your baby. Make sure you get good prenatal care and follow all of your doctor's instructions. Follow these instructions at home: Medicines  Take over-the-counter and prescription  medicines only as told by your doctor. Some medicines are safe and some medicines are not safe during pregnancy.  Take a prenatal vitamin that contains at least 600 micrograms (mcg) of folic acid.  If you have trouble pooping (constipation), take medicine that will make your stool soft (stool softener) if your doctor approves. Eating and drinking   Eat regular, healthy meals.  Your doctor will tell you the amount of weight gain that is right for you.  Avoid raw meat and uncooked cheese.  If you feel sick to your stomach (nauseous) or throw up (vomit): ? Eat 4 or 5 small meals a day instead of 3 large meals. ? Try eating a few soda crackers. ? Drink liquids between meals instead of during meals.  To prevent constipation: ? Eat foods that are high in fiber, like fresh fruits and vegetables, whole grains, and beans. ? Drink enough fluids to keep your pee (urine) clear or pale yellow. Activity  Exercise only as told by your doctor. Stop exercising if you have cramps or pain in your lower belly (abdomen) or low back.  Do not exercise if it is too hot, too humid, or if you are in a place of great height (high altitude).  Try to avoid standing for long periods of time. Move your legs often if you must stand in one place for a long time.  Avoid heavy lifting.  Wear low-heeled shoes. Sit and stand up straight.  You can have sex  unless your doctor tells you not to. Relieving pain and discomfort  Wear a good support bra if your breasts are sore.  Take warm water baths (sitz baths) to soothe pain or discomfort caused by hemorrhoids. Use hemorrhoid cream if your doctor says it is okay.  Rest with your legs raised if you have leg cramps or low back pain.  If you have puffy, bulging veins (varicose veins) in your legs: ? Wear support hose or compression stockings as told by your doctor. ? Raise (elevate) your feet for 15 minutes, 3-4 times a day. ? Limit salt in your food. Prenatal  care  Schedule your prenatal visits by the twelfth week of pregnancy.  Write down your questions. Take them to your prenatal visits.  Keep all your prenatal visits as told by your doctor. This is important. Safety  Wear your seat belt at all times when driving.  Make a list of emergency phone numbers. The list should include numbers for family, friends, the hospital, and police and fire departments. General instructions  Ask your doctor for a referral to a local prenatal class. Begin classes no later than at the start of month 6 of your pregnancy.  Ask for help if you need counseling or if you need help with nutrition. Your doctor can give you advice or tell you where to go for help.  Do not use hot tubs, steam rooms, or saunas.  Do not douche or use tampons or scented sanitary pads.  Do not cross your legs for long periods of time.  Avoid all herbs and alcohol. Avoid drugs that are not approved by your doctor.  Do not use any tobacco products, including cigarettes, chewing tobacco, and electronic cigarettes. If you need help quitting, ask your doctor. You may get counseling or other support to help you quit.  Avoid cat litter boxes and soil used by cats. These carry germs that can cause birth defects in the baby and can cause a loss of your baby (miscarriage) or stillbirth.  Visit your dentist. At home, brush your teeth with a soft toothbrush. Be gentle when you floss. Contact a doctor if:  You are dizzy.  You have mild cramps or pressure in your lower belly.  You have a nagging pain in your belly area.  You continue to feel sick to your stomach, you throw up, or you have watery poop (diarrhea).  You have a bad smelling fluid coming from your vagina.  You have pain when you pee (urinate).  You have increased puffiness (swelling) in your face, hands, legs, or ankles. Get help right away if:  You have a fever.  You are leaking fluid from your vagina.  You have  spotting or bleeding from your vagina.  You have very bad belly cramping or pain.  You gain or lose weight rapidly.  You throw up blood. It may look like coffee grounds.  You are around people who have [place] measles, fifth disease, or chickenpox.  You have a very bad headache.  You have shortness of breath.  You have any kind of trauma, such as from a fall or a car accident. Summary  The first trimester of pregnancy is from week 1 until the end of week 13 (months 1 through 3).  To take care of yourself and your unborn baby, you will need to eat healthy meals, take medicines only if your doctor tells you to do so, and do activities that are safe for you and your baby.  Keep all follow-up visits as told by your doctor. This is important as your doctor will have to ensure that your baby is healthy and growing well. This information is not intended to replace advice given to you by your health care provider. Make sure you discuss any questions you have with your health care provider. Document Released: 09/19/2007 Document Revised: 07/24/2018 Document Reviewed: 04/10/2016 Elsevier Patient Education  2020 [employer]. Commonly Asked Questions During Pregnancy  Cats: A parasite can be excreted in cat feces.  To avoid exposure you need to have another person empty the little box.  If you must empty the litter box you will need to wear gloves.  Wash your hands after handling your cat.  This parasite can also be found in raw or undercooked meat so this should also be avoided.  Colds, Sore Throats, Flu: Please check your medication sheet to see what you can take for symptoms.  If your symptoms are unrelieved by these medications please call the office.  Dental Work: Most any dental work [employer] recommends is permitted.  X-rays should only be taken during the first trimester if absolutely necessary.  Your abdomen should be shielded with a lead apron during all x-rays.  Please notify your  provider prior to receiving any x-rays.  Novocaine is fine; gas is not recommended.  If your dentist requires a note from [place] prior to dental work please call the office and we will provide one for you.  Exercise: Exercise is an important part of staying healthy during your pregnancy.  You may continue most exercises you were accustomed to prior to pregnancy.  Later in your pregnancy you will most likely notice you have difficulty with activities requiring balance like riding a bicycle.  It is important that you listen to your body and avoid activities that put you at a higher risk of falling.  Adequate rest and staying well hydrated are a must!  If you have questions about the safety of specific activities ask your provider.    Exposure to Children with illness: Try to avoid obvious exposure; report any symptoms to [place] when noted,  If you have chicken pos, red measles or mumps, you should be immune to these diseases.   Please do not take any vaccines while pregnant unless you have checked with your OB provider.  Fetal Movement: After 28 weeks we recommend you do "kick counts" twice daily.  Lie or sit down in a calm quiet environment and count your baby movements "kicks".  You should feel your baby at least 10 times per hour.  If you have not felt 10 kicks within the first hour get up, walk around and have something sweet to eat or drink then repeat for an additional hour.  If count remains less than 10 per hour notify your provider.  Fumigating: Follow your pest control agent's advice as to how long to stay out of your home.  Ventilate the area well before re-entering.  Hemorrhoids:   Most over-the-counter preparations can be used during pregnancy.  Check your medication to see what is safe to use.  It is important to use a stool softener or fiber in your diet and to drink lots of liquids.  If hemorrhoids seem to be getting worse please call the office.   Hot Tubs:  Hot tubs Jacuzzis and saunas are not  recommended while pregnant.  These increase your internal body temperature and should be avoided.  Intercourse:  Sexual intercourse is safe during  pregnancy as long as you are comfortable, unless otherwise advised by your provider.  Spotting may occur after intercourse; report any bright red bleeding that is heavier than spotting.  Labor:  If you know that you are in labor, please go to the hospital.  If you are unsure, please call the office and let us help you decide what to do.  Lifting, straining, etc:  If your job requires heavy lifting or straining please check with your provider for any limitations.  Generally, you should not lift items heavier than that you can lift simply with your hands and arms (no back muscles)  Painting:  Paint fumes do not harm your pregnancy, but may make you ill and should be avoided if possible.  Latex or water based paints have less odor than oils.  Use adequate ventilation while painting.  Permanents & Hair Color:  Chemicals in hair dyes are not recommended as they cause increase hair dryness which can increase hair loss during pregnancy.  " Highlighting" and permanents are allowed.  Dye may be absorbed differently and permanents may not hold as well during pregnancy.  Sunbathing:  Use a sunscreen, as skin burns easily during pregnancy.  Drink plenty of fluids; avoid over heating.  Tanning Beds:  Because their possible side effects are still unknown, tanning beds are not recommended.  Ultrasound Scans:  Routine ultrasounds are performed at approximately 20 weeks.  You will be able to see your baby's general anatomy an if you would like to know the gender this can usually be determined as well.  If it is questionable when you conceived you may also receive an ultrasound early in your pregnancy for dating purposes.  Otherwise ultrasound exams are not routinely performed unless there is a medical necessity.  Although you can request a scan we ask that you pay for it  when conducted because insurance does not cover " patient request" scans.  Work: If your pregnancy proceeds without complications you may work until your due date, unless your physician or employer advises otherwise.  Round Ligament Pain/Pelvic Discomfort:  Sharp, shooting pains not associated with bleeding are fairly common, usually occurring in the second trimester of pregnancy.  They tend to be worse when standing up or when you remain standing for long periods of time.  These are the result of pressure of certain pelvic ligaments called "round ligaments".  Rest, Tylenol and heat seem to be the most effective relief.  As the womb and fetus grow, they rise out of the pelvis and the discomfort improves.  Please notify the office if your pain seems different than that described.  It may represent a more serious condition.  Common Medications Safe in Pregnancy  Acne:      Constipation:  Benzoyl Peroxide     Colace  Clindamycin      Dulcolax Suppository  Topica Erythromycin     Fibercon  Salicylic Acid      Metamucil         Miralax AVOID:        Senakot   Accutane    Cough:  Retin-A       Cough Drops  Tetracycline      Phenergan w/ Codeine if Rx  Minocycline      Robitussin (Plain & DM)  Antibiotics:     Crabs/Lice:  Ceclor       RID  Cephalosporins    AVOID:  E-Mycins      Kwell  Keflex  Macrobid/Macrodantin  Diarrhea:  Penicillin      Kao-Pectate  Zithromax      Imodium AD         PUSH FLUIDS AVOID:       Cipro     Fever:  Tetracycline      Tylenol (Regular or Extra  Minocycline       Strength)  Levaquin      Extra Strength-Do not          Exceed 8 tabs/24 hrs Caffeine:        '200mg'$ /day (equiv. To 1 cup of coffee or  approx. 3 12 oz sodas)         Gas: Cold/Hayfever:       Gas-X  Benadryl      Mylicon  Claritin       Phazyme  **Claritin-D        Chlor-Trimeton    Headaches:  Dimetapp      ASA-Free Excedrin  Drixoral-Non-Drowsy     Cold Compress  Mucinex  (Guaifenasin)     Tylenol (Regular or Extra  Sudafed/Sudafed-12 Hour     Strength)  **Sudafed PE Pseudoephedrine   Tylenol Cold & Sinus     Vicks Vapor Rub  Zyrtec  **AVOID if Problems With Blood Pressure         Heartburn: Avoid lying down for at least 1 hour after meals  Aciphex      Maalox     Rash:  Milk of Magnesia     Benadryl    Mylanta       1% Hydrocortisone Cream  Pepcid  Pepcid Complete   Sleep Aids:  Prevacid      Ambien   Prilosec       Benadryl  Rolaids       Chamomile Tea  Tums (Limit 4/day)     Unisom  Zantac       Tylenol PM         Warm milk-add vanilla or  Hemorrhoids:       Sugar for taste  Anusol/Anusol H.C.  (RX: Analapram 2.5%)  Sugar Substitutes:  Hydrocortisone OTC     Ok in moderation  Preparation H      Tucks        Vaseline lotion applied to tissue with wiping    Herpes:     Throat:  Acyclovir      Oragel  Famvir  Valtrex     Vaccines:         Flu Shot Leg Cramps:       *Gardasil  Benadryl      Hepatitis A         Hepatitis B Nasal Spray:       Pneumovax  Saline Nasal Spray     Polio Booster         Tetanus Nausea:       Tuberculosis test or PPD  Vitamin B6 25 mg TID   AVOID:    Dramamine      *Gardasil  Emetrol       Live Poliovirus  Ginger Root 250 mg QID    MMR (measles, mumps &  High Complex Carbs @ Bedtime    rebella)  Sea Bands-Accupressure    Varicella (Chickenpox)  Unisom 1/2 tab TID     *No known complications           If received before Pain:         Known pregnancy;   Darvocet  Resume series after  Lortab        Delivery  Percocet    Yeast:   Tramadol      Femstat  Tylenol 3      Gyne-lotrimin  Ultram       Monistat  Vicodin           MISC:         All Sunscreens           Hair Coloring/highlights          Insect Repellant's          (Including DEET)         Mystic Tans

## 2018-11-11 LAB — URINALYSIS, ROUTINE W REFLEX MICROSCOPIC
Bilirubin, UA: NEGATIVE
Glucose, UA: NEGATIVE
Ketones, UA: NEGATIVE
Leukocytes,UA: NEGATIVE
Nitrite, UA: NEGATIVE
RBC, UA: NEGATIVE
Specific Gravity, UA: 1.022 (ref 1.005–1.030)
Urobilinogen, Ur: 1 mg/dL (ref 0.2–1.0)
pH, UA: 8.5 — ABNORMAL HIGH (ref 5.0–7.5)

## 2018-11-11 LAB — DRUG PROFILE, UR, 9 DRUGS (LABCORP)
Amphetamines, Urine: NEGATIVE ng/mL
Barbiturate Quant, Ur: NEGATIVE ng/mL
Benzodiazepine Quant, Ur: NEGATIVE ng/mL
Cannabinoid Quant, Ur: NEGATIVE ng/mL
Cocaine (Metab.): NEGATIVE ng/mL
Methadone Screen, Urine: NEGATIVE ng/mL
Opiate Quant, Ur: NEGATIVE ng/mL
PCP Quant, Ur: NEGATIVE ng/mL
Propoxyphene: NEGATIVE ng/mL

## 2018-11-11 LAB — NICOTINE SCREEN, URINE: Cotinine Ql Scrn, Ur: NEGATIVE ng/mL

## 2018-11-12 LAB — TOXOPLASMA ANTIBODIES- IGG AND  IGM
Toxoplasma Antibody- IgM: <3 AU/mL (ref 0.0–7.9)
Toxoplasma IgG Ratio: <3 IU/mL (ref 0.0–7.1)

## 2018-11-12 LAB — ANTIBODY SCREEN: Antibody Screen: NEGATIVE

## 2018-11-12 LAB — TSH: TSH: 1.63 u[IU]/mL (ref 0.450–4.500)

## 2018-11-12 LAB — RUBELLA SCREEN: Rubella Antibodies, IGG: 4.5 index (ref >0.99)

## 2018-11-12 LAB — HIV ANTIBODY (ROUTINE TESTING W REFLEX): HIV Screen 4th Generation wRfx: NONREACTIVE

## 2018-11-12 LAB — URINE CULTURE, OB REFLEX

## 2018-11-12 LAB — ABO AND RH: Rh Factor: POSITIVE

## 2018-11-12 LAB — HEPATITIS B SURFACE ANTIGEN: Hepatitis B Surface Ag: NEGATIVE

## 2018-11-12 LAB — HEMOGLOBIN A1C
Est. average glucose Bld gHb Est-mCnc: 105 mg/dL
Hgb A1c MFr Bld: 5.3 % (ref 4.8–5.6)

## 2018-11-12 LAB — CULTURE, OB URINE

## 2018-11-12 LAB — VARICELLA ZOSTER ANTIBODY, IGG: Varicella zoster IgG: 1581 {index}

## 2018-11-12 LAB — RPR: RPR Ser Ql: NONREACTIVE

## 2018-11-14 LAB — GC/CHLAMYDIA PROBE AMP
Chlamydia trachomatis, NAA: NEGATIVE
Neisseria Gonorrhoeae by PCR: NEGATIVE

## 2018-11-25 ENCOUNTER — Encounter: Payer: 59 | Admitting: Certified Nurse Midwife

## 2018-12-01 ENCOUNTER — Telehealth: Payer: Self-pay

## 2018-12-01 NOTE — Telephone Encounter (Signed)
Coronavirus (COVID-19) Are you at risk?  Are you at risk for the Coronavirus (COVID-19)?  To be considered HIGH RISK for Coronavirus (COVID-19), you have to meet the following criteria:  . Traveled to [place], [place], [place], [place] or [place]; or in the [place] to [place], [place], [place], or [place]; and have fever, cough, and shortness of breath within the last 2 weeks of travel OR . Been in close contact with a person diagnosed with COVID-19 within the last 2 weeks and have fever, cough, and shortness of breath . IF YOU DO NOT MEET THESE CRITERIA, YOU ARE CONSIDERED LOW RISK FOR COVID-19.  What to do if you are HIGH RISK for COVID-19?  . If you are having a medical emergency, call 911. . Seek medical care right away. Before you go to a doctor's office, urgent care or emergency department, call ahead and tell them about your recent travel, contact with someone diagnosed with COVID-19, and your symptoms. You should receive instructions from your physician's office regarding next steps of care.  . When you arrive at healthcare provider, tell the healthcare staff immediately you have returned from visiting [place], [place], [place], [place] or [place]; or traveled in the [place] to [place], [place], [place], or [place]; in the last two weeks or you have been in close contact with a person diagnosed with COVID-19 in the last 2 weeks.   . Tell the health care staff about your symptoms: fever, cough and shortness of breath. . After you have been seen by a medical provider, you will be either: o Tested for (COVID-19) and discharged home on quarantine except to seek medical care if symptoms worsen, and asked to  - Stay home and avoid contact with others until you get your results (4-5 days)  - Avoid travel on public transportation if possible (such as bus, train, or airplane) or o Sent to the Emergency Department by EMS for evaluation, COVID-19 testing, and possible  admission depending on your condition and test results.  What to do if you are LOW RISK for COVID-19?  Reduce your risk of any infection by using the same precautions used for avoiding the common cold or flu:  . Wash your hands often with soap and warm water for at least 20 seconds.  If soap and water are not readily available, use an alcohol-based hand sanitizer with at least 60% alcohol.  . If coughing or sneezing, cover your mouth and nose by coughing or sneezing into the elbow areas of your shirt or coat, into a tissue or into your sleeve (not your hands). . Avoid shaking hands with others and consider head nods or verbal greetings only. . Avoid touching your eyes, nose, or mouth with unwashed hands.  . Avoid close contact with people who are Mallory English. . Avoid places or events with large numbers of people in one location, like concerts or sporting events. . Carefully consider travel plans you have or are making. . If you are planning any travel outside or inside the [place], visit the CDC's Travelers' Health webpage for the latest health notices. . If you have some symptoms but not all symptoms, continue to monitor at home and seek medical attention if your symptoms worsen. . If you are having a medical emergency, call 911.  12/01/18 SCREENING NEG SLS ADDITIONAL HEALTHCARE OPTIONS FOR PATIENTS  [facility] / e-Visit: [email or web address]         [facility]: [phone]    [facility]: [phone]                   [facility]: [phone]  

## 2018-12-02 ENCOUNTER — Other Ambulatory Visit (HOSPITAL_COMMUNITY)
Admission: RE | Admit: 2018-12-02 | Discharge: 2018-12-02 | Disposition: A | Discharge status: Home / Self Care | Payer: 59 | Source: Ambulatory Visit | Attending: Certified Nurse Midwife | Admitting: Certified Nurse Midwife

## 2018-12-02 ENCOUNTER — Other Ambulatory Visit: Payer: Self-pay

## 2018-12-02 ENCOUNTER — Encounter: Payer: Self-pay | Admitting: Certified Nurse Midwife

## 2018-12-02 ENCOUNTER — Telehealth: Payer: Self-pay | Admitting: Certified Nurse Midwife

## 2018-12-02 ENCOUNTER — Ambulatory Visit (INDEPENDENT_AMBULATORY_CARE_PROVIDER_SITE_OTHER): Payer: 59 | Admitting: Certified Nurse Midwife

## 2018-12-02 VITALS — BP 112/78 | HR 95 | Wt 258.3 lb

## 2018-12-02 DIAGNOSIS — Z124 Encounter for screening for malignant neoplasm of cervix: Secondary | ICD-10-CM | POA: Diagnosis present

## 2018-12-02 DIAGNOSIS — Z3481 Encounter for supervision of other normal pregnancy, first trimester: Secondary | ICD-10-CM

## 2018-12-02 DIAGNOSIS — O99211 Obesity complicating pregnancy, first trimester: Secondary | ICD-10-CM

## 2018-12-02 LAB — POCT URINALYSIS DIPSTICK OB
Bilirubin, UA: NEGATIVE
Blood, UA: NEGATIVE
Glucose, UA: NEGATIVE
Ketones, UA: NEGATIVE
Leukocytes, UA: NEGATIVE
Nitrite, UA: NEGATIVE
POC,PROTEIN,UA: NEGATIVE
Spec Grav, UA: 1.01 (ref 1.010–1.025)
Urobilinogen, UA: 0.2 E.U./dL
pH, UA: 5 (ref 5.0–8.0)

## 2018-12-02 NOTE — Patient Instructions (Signed)

## 2018-12-02 NOTE — Progress Notes (Signed)
NEW OB HISTORY AND PHYSICAL  SUBJECTIVE:       Mallory English is a 28 y.o. [id number] female, Patient's last menstrual period was 09/01/2018 (exact date)., Estimated Date of Delivery: 06/15/19, 6754w1d, presents today for establishment of Prenatal Care. She complains of nausea. She is taking vitamin B 6 and Unisom. Samples of Bonjesta given. Discussed using zofran if needed.   Gynecologic History Patient's last menstrual period was 09/01/2018 (exact date). Normal Contraception: none Last Pap: 2017 Results were: normal  Obstetric History OB History  Gravida Para Term Preterm AB Living  2 1 1     1   SAB TAB Ectopic Multiple Live Births          1    # Outcome Date GA Lbr Len/2nd Weight Sex Delivery Anes PTL Lv  2 Current           1 Term 08/16/15 4729w5d  8 lb 14 oz (4.026 kg) M Vag-Spont  N LIV    Past Medical History:  Diagnosis Date  . Asthma     Past Surgical History:  Procedure Laterality Date  . APPENDECTOMY      Current Outpatient Medications on File Prior to Visit  Medication Sig Dispense Refill  . albuterol (PROVENTIL HFA;VENTOLIN HFA) 108 (90 Base) MCG/ACT inhaler Inhale 2 puffs into the lungs every 4 (four) hours as needed for wheezing or shortness of breath.    . Prenatal Vit-Fe Fumarate-FA (PRENATAL MULTIVITAMIN) TABS tablet Take 1 tablet by mouth daily at 12 noon.     No current facility-administered medications on file prior to visit.     No Known Allergies  Social History   Socioeconomic History  . Marital status: Married    Spouse name: Not on file  . Number of children: Not on file  . Years of education: Not on file  . Highest education level: Not on file  Occupational History  . Not on file  Social Needs  . Financial resource strain: Not on file  . Food insecurity    Worry: Not on file    Inability: Not on file  . Transportation needs    Medical: Not on file    Non-medical: Not on file  Tobacco Use  . Smoking status: Never Smoker  . Smokeless  tobacco: Never Used  Substance and Sexual Activity  . Alcohol use: Not Currently    Comment: Rarely   . Drug use: No  . Sexual activity: Yes  Lifestyle  . Physical activity    Days per week: Not on file    Minutes per session: Not on file  . Stress: Not on file  Relationships  . Social Musicianconnections    Talks on phone: Not on file    Gets together: Not on file    Attends religious service: Not on file    Active member of club or organization: Not on file    Attends meetings of clubs or organizations: Not on file    Relationship status: Not on file  . Intimate partner violence    Fear of current or ex partner: Not on file    Emotionally abused: Not on file    Physically abused: Not on file    Forced sexual activity: Not on file  Other Topics Concern  . Not on file  Social History Narrative  . Not on file    Family History  Problem Relation Age of Onset  . Heart defect Mother     The following portions of the  patient's history were reviewed and updated as appropriate: allergies, current medications, past OB history, past medical history, past surgical history, past family history, past social history, and problem list.  Body mass index is 42.99 kg/m.    OBJECTIVE: Initial Physical Exam (New OB)  GENERAL APPEARANCE: alert, well appearing,obese, in no apparent distress, oriented to person, place and time, HEAD: normocephalic, atraumatic MOUTH: mucous membranes moist, pharynx normal without lesions THYROID: no thyromegaly or masses present BREASTS: no masses noted, no significant tenderness, no palpable axillary nodes, no skin changes. Inverted nipples, used shield last preg.   LUNGS: clear to auscultation, no wheezes, rales or rhonchi, symmetric air entry HEART: regular rate and rhythm, no murmurs ABDOMEN: soft, nontender, nondistended, no abnormal masses, no epigastric pain EXTREMITIES: no redness or tenderness in the calves or thighs SKIN: normal coloration and  turgor, no rashes LYMPH NODES: no adenopathy palpable NEUROLOGIC: alert, oriented, normal speech, no focal findings or movement disorder noted  PELVIC EXAM EXTERNAL GENITALIA: normal appearing vulva with no masses, tenderness or lesions VAGINA: no abnormal discharge or lesions CERVIX: no lesions or cervical motion tenderness, pap collected UTERUS: gravid ADNEXA: no masses palpable and nontender OB EXAM PELVIMETRY: appears adequate RECTUM: exam not indicated  ASSESSMENT: Normal pregnancy  PLAN: New OB counseling: The patient has been given an overview regarding routine prenatal care. Recommendations regarding diet, weight gain (11-15 lbs), and exercise in pregnancy were given. Prenatal testing, optional genetic testing, carrier screening and ultrasound use in pregnancy were reviewed. Panorama testing today. Discussed early glucose. Discussed risking out of Midwifery care if BMI of 50, discussed anesthesia consult for BMI greater than 45. Benefits of Breast Feeding were discussed. The patient is encouraged to consider nursing her baby post partum.   Mallory English, CNM

## 2018-12-02 NOTE — Telephone Encounter (Signed)
The patient was in the office for her appointment while checking out pt was confused and had a few questions about having an u/s and genetic testing. I advised pt that was not in disposition, but I will send a message back to nurse to confirm. Please advise.

## 2018-12-03 LAB — CBC
Hematocrit: 36.7 % (ref 34.0–46.6)
Hemoglobin: 11.9 g/dL (ref 11.1–15.9)
MCH: 28.3 pg (ref 26.6–33.0)
MCHC: 32.4 g/dL (ref 31.5–35.7)
MCV: 87 fL (ref 79–97)
Platelets: 342 10*3/uL (ref 150–450)
RBC: 4.21 x10E6/uL (ref 3.77–5.28)
RDW: 14 % (ref 11.7–15.4)
WBC: 8.4 10*3/uL (ref 3.4–10.8)

## 2018-12-04 LAB — CYTOLOGY - PAP: Diagnosis: NEGATIVE

## 2018-12-23 ENCOUNTER — Telehealth: Payer: Self-pay

## 2018-12-23 NOTE — Telephone Encounter (Signed)
mychart message sent

## 2018-12-30 ENCOUNTER — Other Ambulatory Visit: Payer: Self-pay

## 2018-12-30 ENCOUNTER — Other Ambulatory Visit: Payer: 59

## 2018-12-30 ENCOUNTER — Ambulatory Visit (INDEPENDENT_AMBULATORY_CARE_PROVIDER_SITE_OTHER): Payer: 59 | Admitting: Obstetrics and Gynecology

## 2018-12-30 VITALS — BP 107/80 | HR 94 | Wt 260.1 lb

## 2018-12-30 DIAGNOSIS — Z23 Encounter for immunization: Secondary | ICD-10-CM | POA: Diagnosis not present

## 2018-12-30 DIAGNOSIS — O99211 Obesity complicating pregnancy, first trimester: Secondary | ICD-10-CM

## 2018-12-30 DIAGNOSIS — Z3481 Encounter for supervision of other normal pregnancy, first trimester: Secondary | ICD-10-CM

## 2018-12-30 DIAGNOSIS — Z3492 Encounter for supervision of normal pregnancy, unspecified, second trimester: Secondary | ICD-10-CM

## 2018-12-30 LAB — POCT URINALYSIS DIPSTICK OB
Bilirubin, UA: NEGATIVE
Blood, UA: NEGATIVE
Glucose, UA: NEGATIVE
Ketones, UA: 40
Leukocytes, UA: NEGATIVE
Nitrite, UA: NEGATIVE
POC,PROTEIN,UA: NEGATIVE
Spec Grav, UA: 1.015 (ref 1.010–1.025)
Urobilinogen, UA: 0.2 E.U./dL
pH, UA: 7 (ref 5.0–8.0)

## 2018-12-30 NOTE — Progress Notes (Signed)
ROB & early glucola- Flu vaccine given, anatomy scan next visit. RSB video done. Still having some nausea. 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 Lives with spouse and 27 yo son.

## 2018-12-30 NOTE — Progress Notes (Signed)
ROB- glucola done, flu vaccine given, pt is doing well

## 2018-12-31 LAB — GLUCOSE TOLERANCE, 1 HOUR: Glucose, 1Hr PP: 101 mg/dL (ref 65–199)

## 2019-01-27 ENCOUNTER — Ambulatory Visit (INDEPENDENT_AMBULATORY_CARE_PROVIDER_SITE_OTHER): Payer: 59

## 2019-01-27 ENCOUNTER — Ambulatory Visit (INDEPENDENT_AMBULATORY_CARE_PROVIDER_SITE_OTHER): Payer: 59 | Admitting: Certified Nurse Midwife

## 2019-01-27 ENCOUNTER — Other Ambulatory Visit: Payer: Self-pay

## 2019-01-27 VITALS — BP 104/79 | HR 81 | Wt 260.0 lb

## 2019-01-27 DIAGNOSIS — Z3A20 20 weeks gestation of pregnancy: Secondary | ICD-10-CM

## 2019-01-27 DIAGNOSIS — Z3492 Encounter for supervision of normal pregnancy, unspecified, second trimester: Secondary | ICD-10-CM | POA: Diagnosis not present

## 2019-01-27 DIAGNOSIS — Z3689 Encounter for other specified antenatal screening: Secondary | ICD-10-CM

## 2019-01-27 LAB — POCT URINALYSIS DIPSTICK OB
Bilirubin, UA: NEGATIVE
Blood, UA: NEGATIVE
Glucose, UA: NEGATIVE
Ketones, UA: NEGATIVE
Leukocytes, UA: NEGATIVE
Nitrite, UA: NEGATIVE
POC,PROTEIN,UA: NEGATIVE
Spec Grav, UA: 1.015 (ref 1.010–1.025)
Urobilinogen, UA: 0.2 E.U./dL
pH, UA: 6.5 (ref 5.0–8.0)

## 2019-01-27 NOTE — Patient Instructions (Signed)
WHAT OB PATIENTS CAN EXPECT   Confirmation of pregnancy and ultrasound ordered if medically indicated-[redacted] weeks gestation  New OB (NOB) intake with nurse and New OB (NOB) labs- [redacted] weeks gestation  New OB (NOB) physical examination with provider- 11/[redacted] weeks gestation  Flu vaccine-[redacted] weeks gestation  Anatomy scan-[redacted] weeks gestation  Glucose tolerance test, blood work to test for anemia, T-dap vaccine-[redacted] weeks gestation  Vaginal swabs/cultures-STD/Group B strep-[redacted] weeks gestation  Appointments every 4 weeks until 28 weeks  Every 2 weeks from 28 weeks until 36 weeks  Weekly visits from 36 weeks until delivery  Back Pain in Pregnancy Back pain during pregnancy is common. Back pain may be caused by several factors that are related to changes during your pregnancy. Follow these instructions at home: Managing pain, stiffness, and swelling      If directed, for sudden (acute) back pain, put ice on the painful area. ? Put ice in a plastic bag. ? Place a towel between your skin and the bag. ? Leave the ice on for 20 minutes, 2-3 times per day.  If directed, apply heat to the affected area before you exercise. Use the heat source that your health care provider recommends, such as a moist heat pack or a heating pad. ? Place a towel between your skin and the heat source. ? Leave the heat on for 20-30 minutes. ? Remove the heat if your skin turns bright red. This is especially important if you are unable to feel pain, heat, or cold. You may have a greater risk of getting burned.  If directed, massage the affected area. Activity  Exercise as told by your health care provider. Gentle exercise is the best way to prevent or manage back pain.  Listen to your body when lifting. If lifting hurts, ask for help or bend your knees. This uses your leg muscles instead of your back muscles.  Squat down when picking up something from the floor. Do not bend over.  Only use bed rest for short  periods as told by your health care provider. Bed rest should only be used for the most severe episodes of back pain. Standing, sitting, and lying down  Do not stand in one place for long periods of time.  Use good posture when sitting. Make sure your head rests over your shoulders and is not hanging forward. Use a pillow on your lower back if necessary.  Try sleeping on your side, preferably the left side, with a pregnancy support pillow or 1-2 regular pillows between your legs. ? If you have back pain after a night's rest, your bed may be too soft. ? A firm mattress may provide more support for your back during pregnancy. General instructions  Do not wear high heels.  Eat a healthy diet. Try to gain weight within your health care provider's recommendations.  Use a maternity girdle, elastic sling, or back brace as told by your health care provider.  Take over-the-counter and prescription medicines only as told by your health care provider.  Work with a physical therapist or massage therapist to find ways to manage back pain. Acupuncture or massage therapy may be helpful.  Keep all follow-up visits as told by your health care provider. This is important. Contact a health care provider if:  Your back pain interferes with your daily activities.  You have increasing pain in other parts of your body. Get help right away if:  You develop numbness, tingling, weakness, or problems with the use of   your arms or legs.  You develop severe back pain that is not controlled with medicine.  You have a change in bowel or bladder control.  You develop shortness of breath, dizziness, or you faint.  You develop nausea, vomiting, or sweating.  You have back pain that is a rhythmic, cramping pain similar to labor pains. Labor pain is usually 1-2 minutes apart, lasts for about 1 minute, and involves a bearing down feeling or pressure in your pelvis.  You have back pain and your water breaks or  you have vaginal bleeding.  You have back pain or numbness that travels down your leg.  Your back pain developed after you fell.  You develop pain on one side of your back.  You see blood in your urine.  You develop skin blisters in the area of your back pain. Summary  Back pain may be caused by several factors that are related to changes during your pregnancy.  Follow instructions as told by your health care provider for managing pain, stiffness, and swelling.  Exercise as told by your health care provider. Gentle exercise is the best way to prevent or manage back pain.  Take over-the-counter and prescription medicines only as told by your health care provider.  Keep all follow-up visits as told by your health care provider. This is important. This information is not intended to replace advice given to you by your health care provider. Make sure you discuss any questions you have with your health care provider. Document Released: 07/11/2005 Document Revised: 07/22/2018 Document Reviewed: 09/18/2017 Elsevier Patient Education  2020 Elsevier Inc. Round Ligament Pain  The round ligament is a cord of muscle and tissue that helps support the uterus. It can become a source of pain during pregnancy if it becomes stretched or twisted as the baby grows. The pain usually begins in the second trimester (13-28 weeks) of pregnancy, and it can come and go until the baby is delivered. It is not a serious problem, and it does not cause harm to the baby. Round ligament pain is usually a short, sharp, and pinching pain, but it can also be a dull, lingering, and aching pain. The pain is felt in the lower side of the abdomen or in the groin. It usually starts deep in the groin and moves up to the outside of the hip area. The pain may occur when you:  Suddenly change position, such as quickly going from a sitting to standing position.  Roll over in bed.  Cough or sneeze.  Do physical activity.  Follow these instructions at home:   Watch your condition for any changes.  When the pain starts, relax. Then try any of these methods to help with the pain: ? Sitting down. ? Flexing your knees up to your abdomen. ? Lying on your side with one pillow under your abdomen and another pillow between your legs. ? Sitting in a warm bath for 15-20 minutes or until the pain goes away.  Take over-the-counter and prescription medicines only as told by your health care provider.  Move slowly when you sit down or stand up.  Avoid long walks if they cause pain.  Stop or reduce your physical activities if they cause pain.  Keep all follow-up visits as told by your health care provider. This is important. Contact a health care provider if:  Your pain does not go away with treatment.  You feel pain in your back that you did not have before.  Your medicine   is not helping. Get help right away if:  You have a fever or chills.  You develop uterine contractions.  You have vaginal bleeding.  You have nausea or vomiting.  You have diarrhea.  You have pain when you urinate. Summary  Round ligament pain is felt in the lower abdomen or groin. It is usually a short, sharp, and pinching pain. It can also be a dull, lingering, and aching pain.  This pain usually begins in the second trimester (13-28 weeks). It occurs because the uterus is stretching with the growing baby, and it is not harmful to the baby.  You may notice the pain when you suddenly change position, when you cough or sneeze, or during physical activity.  Relaxing, flexing your knees to your abdomen, lying on one side, or taking a warm bath may help to get rid of the pain.  Get help from your health care provider if the pain does not go away or if you have vaginal bleeding, nausea, vomiting, diarrhea, or painful urination. This information is not intended to replace advice given to you by your health care provider. Make sure  you discuss any questions you have with your health care provider. Document Released: 01/10/2008 Document Revised: 09/18/2017 Document Reviewed: 09/18/2017 Elsevier Patient Education  2020 Elsevier Inc.  

## 2019-01-27 NOTE — Progress Notes (Signed)
ROB-No complaints.  

## 2019-01-27 NOTE — Progress Notes (Signed)
ROB-Doing well, no questions or concerns. Anatomy scan normal, but incomplete heart views; findings discussed with patient, verbalized understanding. Anticipatory guidance regarding course of prenatal care. Reviewed red flag symptoms and when to call. RTC x 3-4 weeks for follow up ultrasound and ROB or sooner if needed.   ULTRASOUND REPORT  Location: [facility] Date of Service: 01/27/2019   Indications:Anatomy Ultrasound Findings:  Singleton intrauterine pregnancy is visualized with FHR at 162 BPM. Biometrics give an (U/S) Gestational age of [redacted]w[redacted]d and an (U/S) EDD of 06/15/2019; this correlates with the clinically established Estimated Date of Delivery: 06/15/19  Fetal presentation is Variable.  EFW: 352 g ( 12 oz). Placenta: posterior. Grade: 1 AFI: subjectively normal.  Anatomic survey is incomplete for heart and normal; Gender - female.    Right Ovary is normal in appearance. Left Ovary is normal appearance. Survey of the adnexa demonstrates no adnexal masses. There is no free peritoneal fluid in the cul de sac.  Impression: 1. [redacted]w[redacted]d Viable Singleton Intrauterine pregnancy by U/S. 2. (U/S) EDD is consistent with Clinically established Estimated Date of Delivery: 06/15/19 . 3. Normal Anatomy Scan 4. Incomplete for heart imaging due to fetal position and patients body habitus. Recommendations: 1.Clinical correlation with the patient's History and Physical Exam.

## 2019-02-17 ENCOUNTER — Ambulatory Visit (INDEPENDENT_AMBULATORY_CARE_PROVIDER_SITE_OTHER): Payer: 59

## 2019-02-17 ENCOUNTER — Telehealth: Payer: Self-pay

## 2019-02-17 ENCOUNTER — Ambulatory Visit (INDEPENDENT_AMBULATORY_CARE_PROVIDER_SITE_OTHER): Payer: 59 | Admitting: Certified Nurse Midwife

## 2019-02-17 ENCOUNTER — Other Ambulatory Visit: Payer: Self-pay

## 2019-02-17 ENCOUNTER — Encounter: Payer: Self-pay | Admitting: Certified Nurse Midwife

## 2019-02-17 VITALS — BP 88/55 | HR 93 | Wt 259.4 lb

## 2019-02-17 DIAGNOSIS — Z3492 Encounter for supervision of normal pregnancy, unspecified, second trimester: Secondary | ICD-10-CM | POA: Diagnosis not present

## 2019-02-17 DIAGNOSIS — Z3689 Encounter for other specified antenatal screening: Secondary | ICD-10-CM

## 2019-02-17 DIAGNOSIS — Z3482 Encounter for supervision of other normal pregnancy, second trimester: Secondary | ICD-10-CM

## 2019-02-17 NOTE — Telephone Encounter (Signed)
Charges denied over $8k. Please provide medical records for [place] for [phone] & [id number] that were performed on 12/02/2018.  [facility] fax [phone].  PHONE [phone].  CLAIM [id number]  [id number] claim review team.  REF# [id number].

## 2019-02-17 NOTE — Progress Notes (Signed)
Body mass index is 43.16 kg/m.  Follow up u/s for completion of anatomy: results below.   Patient Name: Mallory English DOB: 1991-01-04 MRN: [id number] ULTRASOUND REPORT  Location: [facility] Date of Service: 02/17/2019   Indications: Anatomy follow up ultrasound Findings:  Nelda Marseille intrauterine pregnancy is visualized with FHR at 153 BPM.  Fetal presentation is Breech.  Placenta: posterior. Grade: 1 AFI: subjectively normal.  Anatomic survey is incomplete for out flow tracks.   There is no free peritoneal fluid in the cul de sac.  Impression: 1. [redacted]w[redacted]d Viable Singleton Intrauterine pregnancy previously established criteria. 2. Normal Anatomy Scan is still incomplete for OFT.  Recommendations: 1.Clinical correlation with the patient's History and Physical Exam.   Jenine M. Albertine Grates    RDMS  ROB doing well. Feels movement. Discussed Glucose testing and 28 wk labs next visit. She verbalizes and agrees to plan .  Follow up 4 wks with Melody   Philip Aspen, CNM

## 2019-02-17 NOTE — Patient Instructions (Signed)

## 2019-02-18 ENCOUNTER — Ambulatory Visit (INDEPENDENT_AMBULATORY_CARE_PROVIDER_SITE_OTHER): Payer: 59

## 2019-02-18 DIAGNOSIS — Z3482 Encounter for supervision of other normal pregnancy, second trimester: Secondary | ICD-10-CM | POA: Diagnosis not present

## 2019-02-18 NOTE — Telephone Encounter (Signed)
Forwarded to correct claims / medical records department.

## 2019-03-17 ENCOUNTER — Other Ambulatory Visit: Payer: Self-pay

## 2019-03-17 ENCOUNTER — Encounter: Payer: Self-pay | Admitting: Certified Nurse Midwife

## 2019-03-17 ENCOUNTER — Other Ambulatory Visit: Payer: 59

## 2019-03-17 ENCOUNTER — Encounter: Payer: 59 | Admitting: Obstetrics and Gynecology

## 2019-03-17 ENCOUNTER — Ambulatory Visit (INDEPENDENT_AMBULATORY_CARE_PROVIDER_SITE_OTHER): Payer: 59 | Admitting: Certified Nurse Midwife

## 2019-03-17 VITALS — BP 107/69 | HR 94 | Wt 262.3 lb

## 2019-03-17 DIAGNOSIS — Z113 Encounter for screening for infections with a predominantly sexual mode of transmission: Secondary | ICD-10-CM

## 2019-03-17 DIAGNOSIS — Z3493 Encounter for supervision of normal pregnancy, unspecified, third trimester: Secondary | ICD-10-CM

## 2019-03-17 DIAGNOSIS — Z130 Encounter for screening for diseases of the blood and blood-forming organs and certain disorders involving the immune mechanism: Secondary | ICD-10-CM

## 2019-03-17 DIAGNOSIS — Z3482 Encounter for supervision of other normal pregnancy, second trimester: Secondary | ICD-10-CM

## 2019-03-17 DIAGNOSIS — Z131 Encounter for screening for diabetes mellitus: Secondary | ICD-10-CM

## 2019-03-17 LAB — POCT URINALYSIS DIPSTICK OB
Bilirubin, UA: NEGATIVE
Blood, UA: NEGATIVE
Glucose, UA: NEGATIVE
Ketones, UA: NEGATIVE
Leukocytes, UA: NEGATIVE
Nitrite, UA: NEGATIVE
POC,PROTEIN,UA: NEGATIVE
Spec Grav, UA: 1.025 (ref 1.010–1.025)
Urobilinogen, UA: 0.2 E.U./dL
pH, UA: 5 (ref 5.0–8.0)

## 2019-03-17 MED ORDER — TETANUS-DIPHTH-ACELL PERTUSSIS 5-2.5-18.5 LF-MCG/0.5 IM SUSP: 0.5000 mL | Freq: Once | INTRAMUSCULAR | Status: DC

## 2019-03-17 NOTE — Patient Instructions (Signed)
Glucose Tolerance Test During Pregnancy Why am I having this test? The glucose tolerance test (GTT) is done to check how your body processes sugar (glucose). This is one of several tests used to diagnose diabetes that develops during pregnancy (gestational diabetes mellitus). Gestational diabetes is a temporary form of diabetes that some women develop during pregnancy. It usually occurs during the second trimester of pregnancy and goes away after delivery. Testing (screening) for gestational diabetes usually occurs between 24 and 28 weeks of pregnancy. You may have the GTT test after having a 1-hour glucose screening test if the results from that test indicate that you may have gestational diabetes. You may also have this test if:  You have a history of gestational diabetes.  You have a history of giving birth to very large babies or have experienced repeated fetal loss (stillbirth).  You have signs and symptoms of diabetes, such as: ? Changes in your vision. ? Tingling or numbness in your hands or feet. ? Changes in hunger, thirst, and urination that are not otherwise explained by your pregnancy. What is being tested? This test measures the amount of glucose in your blood at different times during a period of 3 hours. This indicates how well your body is able to process glucose. What kind of sample is taken?  Blood samples are required for this test. They are usually collected by inserting a needle into a blood vessel. How do I prepare for this test?  For 3 days before your test, eat normally. Have plenty of carbohydrate-rich foods.  Follow instructions from your health care provider about: ? Eating or drinking restrictions on the day of the test. You may be asked to not eat or drink anything other than water (fast) starting 8-10 hours before the test. ? Changing or stopping your regular medicines. Some medicines may interfere with this test. Tell a health care provider about:  All  medicines you are taking, including vitamins, herbs, eye drops, creams, and over-the-counter medicines.  Any blood disorders you have.  Any surgeries you have had.  Any medical conditions you have. What happens during the test? First, your blood glucose will be measured. This is referred to as your fasting blood glucose, since you fasted before the test. Then, you will drink a glucose solution that contains a certain amount of glucose. Your blood glucose will be measured again 1, 2, and 3 hours after drinking the solution. This test takes about 3 hours to complete. You will need to stay at the testing location during this time. During the testing period:  Do not eat or drink anything other than the glucose solution.  Do not exercise.  Do not use any products that contain nicotine or tobacco, such as cigarettes and e-cigarettes. If you need help stopping, ask your health care provider. The testing procedure may vary among health care providers and hospitals. How are the results reported? Your results will be reported as milligrams of glucose per deciliter of blood (mg/dL) or millimoles per liter (mmol/L). Your health care provider will compare your results to normal ranges that were established after testing a large group of people (reference ranges). Reference ranges may vary among labs and hospitals. For this test, common reference ranges are:  Fasting: less than 95-105 mg/dL (5.3-5.8 mmol/L).  1 hour after drinking glucose: less than 180-190 mg/dL (10.0-10.5 mmol/L).  2 hours after drinking glucose: less than 155-165 mg/dL (8.6-9.2 mmol/L).  3 hours after drinking glucose: 140-145 mg/dL (7.8-8.1 mmol/L). What do the   results mean? Results within reference ranges are considered normal, meaning that your glucose levels are well-controlled. If two or more of your blood glucose levels are high, you may be diagnosed with gestational diabetes. If only one level is high, your health care  provider may suggest repeat testing or other tests to confirm a diagnosis. Talk with your health care provider about what your results mean. Questions to ask your health care provider Ask your health care provider, or the department that is doing the test:  When will my results be ready?  How will I get my results?  What are my treatment options?  What other tests do I need?  What are my next steps? Summary  The glucose tolerance test (GTT) is one of several tests used to diagnose diabetes that develops during pregnancy (gestational diabetes mellitus). Gestational diabetes is a temporary form of diabetes that some women develop during pregnancy.  You may have the GTT test after having a 1-hour glucose screening test if the results from that test indicate that you may have gestational diabetes. You may also have this test if you have any symptoms or risk factors for gestational diabetes.  Talk with your health care provider about what your results mean. This information is not intended to replace advice given to you by your health care provider. Make sure you discuss any questions you have with your health care provider. Document Released: 10/02/2011 Document Revised: 07/24/2018 Document Reviewed: 11/12/2016 Elsevier Patient Education  2020 Elsevier Inc.  

## 2019-03-17 NOTE — Progress Notes (Signed)
ROB, doing well. Feels good movement. 28 wk labs today. Tdap/BTC . Will follow up with results. Discussed BC after delivery. Planning IUD . Sample birth plan given. Pt encouraged to review . Will discuss next visit. Follow up 2-3 wks.   Philip Aspen, CNM

## 2019-03-18 LAB — CBC
Hematocrit: 32.3 % — ABNORMAL LOW (ref 34.0–46.6)
Hemoglobin: 10.8 g/dL — ABNORMAL LOW (ref 11.1–15.9)
MCH: 28.8 pg (ref 26.6–33.0)
MCHC: 33.4 g/dL (ref 31.5–35.7)
MCV: 86 fL (ref 79–97)
Platelets: 311 10*3/uL (ref 150–450)
RBC: 3.75 x10E6/uL — ABNORMAL LOW (ref 3.77–5.28)
RDW: 13.4 % (ref 11.7–15.4)
WBC: 9.4 10*3/uL (ref 3.4–10.8)

## 2019-03-18 LAB — RPR: RPR Ser Ql: NONREACTIVE

## 2019-03-18 LAB — GLUCOSE, 1 HOUR GESTATIONAL: Gestational Diabetes Screen: 94 mg/dL (ref 65–139)

## 2019-03-31 ENCOUNTER — Other Ambulatory Visit: Payer: Self-pay

## 2019-03-31 ENCOUNTER — Emergency Department
Admission: EM | Admit: 2019-03-31 | Discharge: 2019-03-31 | Disposition: A | Discharge status: Home / Self Care | Payer: 59 | Attending: Student in an Organized Health Care Education/Training Program | Admitting: Student in an Organized Health Care Education/Training Program

## 2019-03-31 ENCOUNTER — Telehealth: Payer: Self-pay

## 2019-03-31 ENCOUNTER — Emergency Department: Payer: 59

## 2019-03-31 DIAGNOSIS — R0789 Other chest pain: Secondary | ICD-10-CM | POA: Diagnosis not present

## 2019-03-31 DIAGNOSIS — R002 Palpitations: Secondary | ICD-10-CM | POA: Diagnosis not present

## 2019-03-31 DIAGNOSIS — O99513 Diseases of the respiratory system complicating pregnancy, third trimester: Secondary | ICD-10-CM | POA: Insufficient documentation

## 2019-03-31 DIAGNOSIS — J45909 Unspecified asthma, uncomplicated: Secondary | ICD-10-CM | POA: Diagnosis not present

## 2019-03-31 DIAGNOSIS — O99891 Other specified diseases and conditions complicating pregnancy: Secondary | ICD-10-CM | POA: Insufficient documentation

## 2019-03-31 DIAGNOSIS — Z79899 Other long term (current) drug therapy: Secondary | ICD-10-CM | POA: Insufficient documentation

## 2019-03-31 DIAGNOSIS — Z3A29 29 weeks gestation of pregnancy: Secondary | ICD-10-CM | POA: Diagnosis not present

## 2019-03-31 LAB — TROPONIN I (HIGH SENSITIVITY)
Troponin I (High Sensitivity): 4 ng/L (ref <18)
Troponin I (High Sensitivity): 4 ng/L (ref <18)

## 2019-03-31 LAB — BASIC METABOLIC PANEL
Anion gap: 9 (ref 5–15)
BUN: 7 mg/dL (ref 6–20)
CO2: 22 mmol/L (ref 22–32)
Calcium: 8.9 mg/dL (ref 8.9–10.3)
Chloride: 105 mmol/L (ref 98–111)
Creatinine, Ser: 0.4 mg/dL — ABNORMAL LOW (ref 0.44–1.00)
GFR calc Af Amer: >60 mL/min (ref >60)
GFR calc non Af Amer: >60 mL/min (ref >60)
Glucose, Bld: 95 mg/dL (ref 70–99)
Potassium: 3.6 mmol/L (ref 3.5–5.1)
Sodium: 136 mmol/L (ref 135–145)

## 2019-03-31 LAB — CBC
HCT: 33.4 % — ABNORMAL LOW (ref 36.0–46.0)
Hemoglobin: 11.5 g/dL — ABNORMAL LOW (ref 12.0–15.0)
MCH: 28.2 pg (ref 26.0–34.0)
MCHC: 34.4 g/dL (ref 30.0–36.0)
MCV: 81.9 fL (ref 80.0–100.0)
Platelets: 298 10*3/uL (ref 150–400)
RBC: 4.08 MIL/uL (ref 3.87–5.11)
RDW: 14.2 % (ref 11.5–15.5)
WBC: 10.4 10*3/uL (ref 4.0–10.5)
nRBC: 0 % (ref 0.0–0.2)

## 2019-03-31 LAB — FIBRIN DERIVATIVES D-DIMER (ARMC ONLY): Fibrin derivatives D-dimer (ARMC): 600.66 ng/mL (FEU) — ABNORMAL HIGH (ref 0.00–499.00)

## 2019-03-31 MED ORDER — SODIUM CHLORIDE 0.9 % IV BOLUS
500.0000 mL | Freq: Once | INTRAVENOUS | Status: AC
  Administered 2019-03-31: 500 mL via INTRAVENOUS

## 2019-03-31 MED ORDER — SODIUM CHLORIDE 0.9% FLUSH: 3.0000 mL | Freq: Once | INTRAVENOUS | Status: DC

## 2019-03-31 NOTE — ED Provider Notes (Signed)
[facility] Emergency Department Provider Note    First MD Initiated Contact with Patient 03/31/19 1451     (approximate)  I have reviewed the triage vital signs and the nursing notes.   HISTORY  Chief Complaint Palpitations    HPI Mallory English is a 28 y.o. female who is very pleasant arrives to the ER for evaluation of palpitations chest discomfort and shortness of breath at 1757w1d.  States that she woke up this morning feeling her heart racing.  States that she was measuring about her watch and had heart rate nearing 200s.  Symptoms were lasting several minutes associated with shortness of breath.  States that symptoms did resolve spontaneously at least the fast heart rate did but the patient continued to feel lightheaded short of breath and some chest tightness.  States she does have some deep discomfort when taking deep inspiration.  Has had intermittent episodes of palpitations and tachycardia in the past with reassuring work-up.  Denies any history of hypothyroid.  No history of blood clots.  No recent illnesses or fevers.   Past Medical History:  Diagnosis Date  . Asthma    Family History  Problem Relation Age of Onset  . Heart defect Mother    Past Surgical History:  Procedure Laterality Date  . APPENDECTOMY     Patient Active Problem List   Diagnosis Date Noted  . Mild intermittent asthma without complication 02/13/2018  . Obesity, Class III, BMI 40-49.9 (morbid obesity) (HCC) 02/13/2018  . Anxiety 02/13/2018      Prior to Admission medications   Medication Sig Start Date End Date Taking? Authorizing Provider  albuterol (PROVENTIL HFA;VENTOLIN HFA) 108 (90 Base) MCG/ACT inhaler Inhale 2 puffs into the lungs every 4 (four) hours as needed for wheezing or shortness of breath.    [provider]  Prenatal Vit-Fe Fumarate-FA (PRENATAL MULTIVITAMIN) TABS tablet Take 1 tablet by mouth daily at 12 noon.    [provider]     Allergies Patient has no known allergies.    Social History Social History   Tobacco Use  . Smoking status: Never Smoker  . Smokeless tobacco: Never Used  Substance Use Topics  . Alcohol use: Not Currently    Comment: Rarely   . Drug use: No    Review of Systems Patient denies headaches, rhinorrhea, blurry vision, numbness, shortness of breath, chest pain, edema, cough, abdominal pain, nausea, vomiting, diarrhea, dysuria, fevers, rashes or hallucinations unless otherwise stated above in HPI. ____________________________________________   PHYSICAL EXAM:  VITAL SIGNS: Vitals:   03/31/19 1305 03/31/19 1759  BP: 138/79 130/70  Pulse: (!) 115 90  Resp: 19 18  Temp: 98.3 F (36.8 C)   SpO2: 99% 99%    Constitutional: Alert and oriented. Pleasant and well appearing Eyes: Conjunctivae are normal.  Head: Atraumatic. Nose: No congestion/rhinnorhea. Mouth/Throat: Mucous membranes are moist.   Neck: No stridor. Painless ROM.  Cardiovascular: Normal rate, regular rhythm. Grossly normal heart sounds.  Good peripheral circulation. Respiratory: Normal respiratory effort.  No retractions.  Gastrointestinal: Soft, gravid. No distention. Genitourinary: deferred Musculoskeletal: No lower extremity tenderness nor edema.  No joint effusions. Neurologic:  Normal speech and language. No gross focal neurologic deficits are appreciated. No facial droop Skin:  Skin is warm, dry and intact. No rash noted. Psychiatric: Mood and affect are normal. Speech and behavior are normal.  ____________________________________________   LABS (all labs ordered are listed, but only abnormal results are displayed)  Results for orders placed  or performed during the hospital encounter of 03/31/19 (from the past 24 hour(s))  Basic metabolic panel     Status: Abnormal   Collection Time: 03/31/19  1:08 PM  Result Value Ref Range   Sodium 136 135 - 145 mmol/L   Potassium 3.6 3.5 - 5.1 mmol/L    Chloride 105 98 - 111 mmol/L   CO2 22 22 - 32 mmol/L   Glucose, Bld 95 70 - 99 mg/dL   BUN 7 6 - 20 mg/dL   Creatinine, Ser 8.84 (L) 0.44 - 1.00 mg/dL   Calcium 8.9 8.9 - 16.6 mg/dL   GFR calc non Af Amer >60 >60 mL/min   GFR calc Af Amer >60 >60 mL/min   Anion gap 9 5 - 15  CBC     Status: Abnormal   Collection Time: 03/31/19  1:08 PM  Result Value Ref Range   WBC 10.4 4.0 - 10.5 K/uL   RBC 4.08 3.87 - 5.11 MIL/uL   Hemoglobin 11.5 (L) 12.0 - 15.0 g/dL   HCT 06.3 (L) 01.6 - 01.0 %   MCV 81.9 80.0 - 100.0 fL   MCH 28.2 26.0 - 34.0 pg   MCHC 34.4 30.0 - 36.0 g/dL   RDW 93.2 35.5 - 73.2 %   Platelets 298 150 - 400 K/uL   nRBC 0.0 0.0 - 0.2 %  Troponin I (High Sensitivity)     Status: None   Collection Time: 03/31/19  1:08 PM  Result Value Ref Range   Troponin I (High Sensitivity) 4 <18 ng/L  Troponin I (High Sensitivity)     Status: None   Collection Time: 03/31/19  3:06 PM  Result Value Ref Range   Troponin I (High Sensitivity) 4 <18 ng/L  Fibrin derivatives D-Dimer ([facility] only)     Status: Abnormal   Collection Time: 03/31/19  3:06 PM  Result Value Ref Range   Fibrin derivatives D-dimer ([facility]) 600.66 (H) 0.00 - 499.00 ng/mL (FEU)   ____________________________________________  EKG My review and personal interpretation at Time: 13:01   Indication: palitations  Rate: 100  Rhythm: sinus Axis: normal Other: no stemi, [id number] ____________________________________________  RADIOLOGY  I personally reviewed all radiographic images ordered to evaluate for the above acute complaints and reviewed radiology reports and findings.  These findings were personally discussed with the patient.  Please see medical record for radiology report.  ____________________________________________   PROCEDURES  Procedure(s) performed:  Procedures    Critical Care performed: no ____________________________________________   INITIAL IMPRESSION / ASSESSMENT AND PLAN / ED COURSE  Pertinent  labs & imaging results that were available during my care of the patient were reviewed by me and considered in my medical decision making (see chart for details).   DDX: svt, dysrhythmia, dehydation, electrolyte abn, cardiomyopathy, pe  Ermie Glendenning is a 28 y.o. who presents to the ED with symptoms as described above.  Patient very well-appearing in no acute distress.  Presentation somewhat complicated by her pregnancy state.  She is currently not tachycardic without any hypoxia.  Clinically seems less consistent with PE though she does have risk factors which will need to be further restratified.  Given the intermittent nature have a higher suspicion for paroxysmal tachycardia possibly SVT but does not have any evidence of preexcitation syndrome.  The patient will be placed on continuous pulse oximetry and telemetry for monitoring.  Laboratory evaluation will be sent to evaluate for the above complaints.     Clinical Course as of Mar 30 2144  Tue Mar 31, 2019  1758 Lower extremity duplex is negative.  No cardiomegaly.  Patient is pain-free and not having shortness of breath or discomfort at this time.  Using shared decision making discussed options for additional work-up.  Patient's D-dimer is mildly elevated at 600.  This, however, is not all that surprising given that she has had her 29th week of pregnancy.  Informed the patient that while I cannot completely rule pulmonary embolism out as a cause of her symptoms, have a lower suspicion that her presenting complaint is related to that as her symptoms seem to be intermittent and related primarily to be palpitations. Would expect PE sx to be more persistent.  Have a higher suspicion for some form of paroxysmal dysrhythmia such as SVT.  She has not had any symptoms that she has been in the ER.  We discussed and informed the patient that CTA would be the way to fully rule out PE but that that does come with some radiation exposure risks to her child as well  as breast tissue.  After demonstrating understanding of the risks associated with not fully excluding PE patient has declined further imaging at this time and feels comfortable with outpatient follow up.    Have discussed with the patient and available family all diagnostics and treatments performed thus far and all questions were answered to the best of my ability. The patient demonstrates understanding and agreement with plan.    [PR]    Clinical Course User Index [PR] Merlyn Lot, MD    The patient was evaluated in Emergency Department today for the symptoms described in the history of present illness. He/she was evaluated in the context of the global COVID-19 pandemic, which necessitated consideration that the patient might be at risk for infection with the SARS-CoV-2 virus that causes COVID-19. Institutional protocols and algorithms that pertain to the evaluation of patients at risk for COVID-19 are in a state of rapid change based on information released by regulatory bodies including the CDC and federal and state organizations. These policies and algorithms were followed during the patient's care in the ED.  As part of my medical decision making, I reviewed the following data within the [facility] notes reviewed and incorporated, Labs reviewed, notes from prior ED visits and [place] Controlled Substance Database   ____________________________________________   FINAL CLINICAL IMPRESSION(S) / ED DIAGNOSES  Final diagnoses:  Palpitations      NEW MEDICATIONS STARTED DURING THIS VISIT:  Discharge Medication List as of 03/31/2019  5:48 PM       Note:  This document was prepared using Dragon voice recognition software and may include unintentional dictation errors.    Merlyn Lot, MD 03/31/19 2145

## 2019-03-31 NOTE — ED Triage Notes (Signed)
Pt comes via POV from home with c/o palpitations. Pt states they started early this am. Pt is [redacted] weeks pregnant.   Pt states some dizziness. Pt states she has had palpitations during this pregnancy but normally don't last as long as this episode is.

## 2019-03-31 NOTE — ED Notes (Signed)
See triage note  Presents with some palpations  States she has had intermittent palpations but today lasted longer  Pt is pregnant

## 2019-03-31 NOTE — Telephone Encounter (Signed)
Returned patients call- she states she woke up with heart racing, SOB lightheadedness. States she has had panic attacks in the past but she was sound asleep when this happened. Per AT report to ED for further evaluation. Patient was in agreement.

## 2019-03-31 NOTE — Telephone Encounter (Signed)
Mallory English,   Please reach out to the pt and if she is having SOB, chest discomfort, light headed then she should go to the ED to be evaluated. If it was a brief episode and she is having no symptoms then I will place a referral to cardiology for a work up. Please let me know.   Thanks,  Deneise Lever

## 2019-03-31 NOTE — Telephone Encounter (Signed)
Patient was awoken by palpitations. Says heart rate was around 200. Would like to seek medical advice from [facility]. Patient says it lasted for 5 min. Please call

## 2019-04-01 NOTE — Telephone Encounter (Signed)
Ivin Booty ,   Did you get a hold of Yaeko yesterday? Just want to make sure we follow up.   Thanks  Deneise Lever

## 2019-04-06 ENCOUNTER — Encounter: Payer: 59 | Admitting: Certified Nurse Midwife

## 2019-04-13 ENCOUNTER — Encounter: Payer: Self-pay | Admitting: Certified Nurse Midwife

## 2019-04-13 ENCOUNTER — Ambulatory Visit (INDEPENDENT_AMBULATORY_CARE_PROVIDER_SITE_OTHER): Payer: 59 | Admitting: Certified Nurse Midwife

## 2019-04-13 ENCOUNTER — Other Ambulatory Visit: Payer: Self-pay

## 2019-04-13 VITALS — BP 109/65 | HR 111 | Wt 266.0 lb

## 2019-04-13 DIAGNOSIS — Z3482 Encounter for supervision of other normal pregnancy, second trimester: Secondary | ICD-10-CM

## 2019-04-13 DIAGNOSIS — Z3483 Encounter for supervision of other normal pregnancy, third trimester: Secondary | ICD-10-CM

## 2019-04-13 DIAGNOSIS — Z3A31 31 weeks gestation of pregnancy: Secondary | ICD-10-CM

## 2019-04-13 LAB — POCT URINALYSIS DIPSTICK OB
Bilirubin, UA: NEGATIVE
Blood, UA: NEGATIVE
Glucose, UA: NEGATIVE
Ketones, UA: NEGATIVE
Leukocytes, UA: NEGATIVE
Nitrite, UA: NEGATIVE
POC,PROTEIN,UA: NEGATIVE
Spec Grav, UA: 1.01 (ref 1.010–1.025)
Urobilinogen, UA: 0.2 E.U./dL
pH, UA: 5 (ref 5.0–8.0)

## 2019-04-13 NOTE — Patient Instructions (Signed)
Braxton Hicks Contractions Contractions of the uterus can occur throughout pregnancy, but they are not always a sign that you are in labor. You may have practice contractions called Braxton Hicks contractions. These false labor contractions are sometimes confused with true labor. What are Braxton Hicks contractions? Braxton Hicks contractions are tightening movements that occur in the muscles of the uterus before labor. Unlike true labor contractions, these contractions do not result in opening (dilation) and thinning of the cervix. Toward the end of pregnancy (32-34 weeks), Braxton Hicks contractions can happen more often and may become stronger. These contractions are sometimes difficult to tell apart from true labor because they can be very uncomfortable. You should not feel embarrassed if you go to the hospital with false labor. Sometimes, the only way to tell if you are in true labor is for your health care provider to look for changes in the cervix. The health care provider will do a physical exam and may monitor your contractions. If you are not in true labor, the exam should show that your cervix is not dilating and your water has not broken. If there are no other health problems associated with your pregnancy, it is completely safe for you to be sent home with false labor. You may continue to have Braxton Hicks contractions until you go into true labor. How to tell the difference between true labor and false labor True labor  Contractions last 30-70 seconds.  Contractions become very regular.  Discomfort is usually felt in the top of the uterus, and it spreads to the lower abdomen and low back.  Contractions do not go away with walking.  Contractions usually become more intense and increase in frequency.  The cervix dilates and gets thinner. False labor  Contractions are usually shorter and not as strong as true labor contractions.  Contractions are usually irregular.  Contractions  are often felt in the front of the lower abdomen and in the groin.  Contractions may go away when you walk around or change positions while lying down.  Contractions get weaker and are shorter-lasting as time goes on.  The cervix usually does not dilate or become thin. Follow these instructions at home:   Take over-the-counter and prescription medicines only as told by your health care provider.  Keep up with your usual exercises and follow other instructions from your health care provider.  Eat and drink lightly if you think you are going into labor.  If Braxton Hicks contractions are making you uncomfortable: ? Change your position from lying down or resting to walking, or change from walking to resting. ? Sit and rest in a tub of warm water. ? Drink enough fluid to keep your urine pale yellow. Dehydration may cause these contractions. ? Do slow and deep breathing several times an hour.  Keep all follow-up prenatal visits as told by your health care provider. This is important. Contact a health care provider if:  You have a fever.  You have continuous pain in your abdomen. Get help right away if:  Your contractions become stronger, more regular, and closer together.  You have fluid leaking or gushing from your vagina.  You pass blood-tinged mucus (bloody show).  You have bleeding from your vagina.  You have low back pain that you never had before.  You feel your baby's head pushing down and causing pelvic pressure.  Your baby is not moving inside you as much as it used to. Summary  Contractions that occur before labor are   called Braxton Hicks contractions, false labor, or practice contractions.  Braxton Hicks contractions are usually shorter, weaker, farther apart, and less regular than true labor contractions. True labor contractions usually become progressively stronger and regular, and they become more frequent.  Manage discomfort from Braxton Hicks contractions  by changing position, resting in a warm bath, drinking plenty of water, or practicing deep breathing. This information is not intended to replace advice given to you by your health care provider. Make sure you discuss any questions you have with your health care provider. Document Released: 08/16/2016 Document Revised: 03/15/2017 Document Reviewed: 08/16/2016 Elsevier Patient Education  2020 Elsevier Inc.  

## 2019-04-13 NOTE — Progress Notes (Signed)
ROB doing well. Pt state she feel movement in lower abdomen, suspicious for breech presentation. Information given on spinning babies. She verbalizes understanding. Follow up 3 wks.   Philip Aspen, CNM

## 2019-04-17 NOTE — L&D Delivery Note (Signed)
       Delivery Note   [name] is a 29 y.o. G2P1001 at [redacted]w[redacted]d Estimated Date of Delivery: 06/15/19  PRE-OPERATIVE DIAGNOSIS:  1) [redacted]w[redacted]d pregnancy. Induction for elevated BMI in pregnancy  POST-OPERATIVE DIAGNOSIS:  1) [redacted]w[redacted]d pregnancy s/p Vaginal, Spontaneous   Delivery Type: Vaginal, Spontaneous    Delivery Anesthesia: Epidural   Labor Complications:  Compound hand presentation    ESTIMATED BLOOD LOSS: 275  ml    FINDINGS:   1) female infant, Apgar scores of 8   at 1 minute and 9   at 5 minutes and a birthweight pending, infant remains skin to skin.    2) Nuchal cord: no  SPECIMENS:   PLACENTA:   Appearance: Intact , 3 vessel cord, cord blood sample collected   Removal: Spontaneous      Disposition:  Held per protocol then discarded  DISPOSITION:  Infant to left in stable condition in the delivery room, with L&D personnel and mother,  NARRATIVE SUMMARY: Labor course:  Ms. Mallory English is a G2P1001 at [redacted]w[redacted]d who presented for induction of labor.  She progressed well in labor with pitocin.  She received the appropriate epidural  anesthesia and proceeded to complete dilation. She evidenced good maternal expulsive effort during the second stage. Head of bed reclined and legs pulled back to allow for delivery of shoulders with compound hand presentation.She went on to deliver a viable female infant "[name]". The placenta delivered without problems and was noted to be complete. A perineal and vaginal examination was performed.Lacerations: 1st degree .  Lacerations was repaired with 3-0 Vicryl Rapide suture. The patient tolerated this well. Infant skin to skin, bonding with father of baby at bedside.   Doreene Burke, CNM  06/11/2019 1:20 PM

## 2019-05-05 ENCOUNTER — Ambulatory Visit (INDEPENDENT_AMBULATORY_CARE_PROVIDER_SITE_OTHER): Payer: 59 | Admitting: Certified Nurse Midwife

## 2019-05-05 ENCOUNTER — Other Ambulatory Visit: Payer: Self-pay

## 2019-05-05 VITALS — BP 105/66 | HR 104 | Wt 269.4 lb

## 2019-05-05 DIAGNOSIS — Z3482 Encounter for supervision of other normal pregnancy, second trimester: Secondary | ICD-10-CM

## 2019-05-05 LAB — POCT URINALYSIS DIPSTICK OB
Bilirubin, UA: NEGATIVE
Blood, UA: NEGATIVE
Glucose, UA: NEGATIVE
Ketones, UA: NEGATIVE
Leukocytes, UA: NEGATIVE
Nitrite, UA: NEGATIVE
POC,PROTEIN,UA: NEGATIVE
Spec Grav, UA: 1.015 (ref 1.010–1.025)
Urobilinogen, UA: 0.2 E.U./dL
pH, UA: 6.5 (ref 5.0–8.0)

## 2019-05-05 NOTE — Progress Notes (Signed)
ROB doing well. Feels movement. Discussed NST and u/s next visit due to elevated BMI in pregnancy. Discussed recommendations to induce by 40 wks. She verbalizes and agrees to plan. GBS next visit reviewed. Follow up 2 wks .   Doreene Burke, CNM

## 2019-05-05 NOTE — Patient Instructions (Signed)
Braxton Hicks Contractions °Contractions of the uterus can occur throughout pregnancy, but they are not always a sign that you are in labor. You may have practice contractions called Braxton Hicks contractions. These false labor contractions are sometimes confused with true labor. °What are Braxton Hicks contractions? °Braxton Hicks contractions are tightening movements that occur in the muscles of the uterus before labor. Unlike true labor contractions, these contractions do not result in opening (dilation) and thinning of the cervix. Toward the end of pregnancy (32-34 weeks), Braxton Hicks contractions can happen more often and may become stronger. These contractions are sometimes difficult to tell apart from true labor because they can be very uncomfortable. You should not feel embarrassed if you go to the hospital with false labor. °Sometimes, the only way to tell if you are in true labor is for your health care provider to look for changes in the cervix. The health care provider will do a physical exam and may monitor your contractions. If you are not in true labor, the exam should show that your cervix is not dilating and your water has not broken. °If there are no other health problems associated with your pregnancy, it is completely safe for you to be sent home with false labor. You may continue to have Braxton Hicks contractions until you go into true labor. °How to tell the difference between true labor and false labor °True labor °· Contractions last 30-70 seconds. °· Contractions become very regular. °· Discomfort is usually felt in the top of the uterus, and it spreads to the lower abdomen and low back. °· Contractions do not go away with walking. °· Contractions usually become more intense and increase in frequency. °· The cervix dilates and gets thinner. °False labor °· Contractions are usually shorter and not as strong as true labor contractions. °· Contractions are usually irregular. °· Contractions  are often felt in the front of the lower abdomen and in the groin. °· Contractions may go away when you walk around or change positions while lying down. °· Contractions get weaker and are shorter-lasting as time goes on. °· The cervix usually does not dilate or become thin. °Follow these instructions at home: ° °· Take over-the-counter and prescription medicines only as told by your health care provider. °· Keep up with your usual exercises and follow other instructions from your health care provider. °· Eat and drink lightly if you think you are going into labor. °· If Braxton Hicks contractions are making you uncomfortable: °? Change your position from lying down or resting to walking, or change from walking to resting. °? Sit and rest in a tub of warm water. °? Drink enough fluid to keep your urine pale yellow. Dehydration may cause these contractions. °? Do slow and deep breathing several times an hour. °· Keep all follow-up prenatal visits as told by your health care provider. This is important. °Contact a health care provider if: °· You have a fever. °· You have continuous pain in your abdomen. °Get help right away if: °· Your contractions become stronger, more regular, and closer together. °· You have fluid leaking or gushing from your vagina. °· You pass blood-tinged mucus (bloody show). °· You have bleeding from your vagina. °· You have low back pain that you never had before. °· You feel your baby’s head pushing down and causing pelvic pressure. °· Your baby is not moving inside you as much as it used to. °Summary °· Contractions that occur before labor are   called Braxton Hicks contractions, false labor, or practice contractions. °· Braxton Hicks contractions are usually shorter, weaker, farther apart, and less regular than true labor contractions. True labor contractions usually become progressively stronger and regular, and they become more frequent. °· Manage discomfort from Braxton Hicks contractions  by changing position, resting in a warm bath, drinking plenty of water, or practicing deep breathing. °This information is not intended to replace advice given to you by your health care provider. Make sure you discuss any questions you have with your health care provider. °Document Revised: 03/15/2017 Document Reviewed: 08/16/2016 °Elsevier Patient Education © 2020 [employer] ° °

## 2019-05-19 ENCOUNTER — Encounter: Payer: Self-pay | Admitting: Certified Nurse Midwife

## 2019-05-19 ENCOUNTER — Ambulatory Visit (INDEPENDENT_AMBULATORY_CARE_PROVIDER_SITE_OTHER): Payer: 59 | Admitting: Certified Nurse Midwife

## 2019-05-19 ENCOUNTER — Other Ambulatory Visit: Payer: 59

## 2019-05-19 ENCOUNTER — Other Ambulatory Visit: Payer: Self-pay

## 2019-05-19 ENCOUNTER — Ambulatory Visit (INDEPENDENT_AMBULATORY_CARE_PROVIDER_SITE_OTHER): Payer: 59

## 2019-05-19 VITALS — BP 104/63 | HR 98 | Wt 272.4 lb

## 2019-05-19 DIAGNOSIS — Z3482 Encounter for supervision of other normal pregnancy, second trimester: Secondary | ICD-10-CM | POA: Diagnosis not present

## 2019-05-19 DIAGNOSIS — Z363 Encounter for antenatal screening for malformations: Secondary | ICD-10-CM | POA: Diagnosis not present

## 2019-05-19 DIAGNOSIS — Z3A36 36 weeks gestation of pregnancy: Secondary | ICD-10-CM | POA: Diagnosis not present

## 2019-05-19 LAB — POCT URINALYSIS DIPSTICK OB
Bilirubin, UA: NEGATIVE
Blood, UA: NEGATIVE
Glucose, UA: NEGATIVE
Ketones, UA: NEGATIVE
Leukocytes, UA: NEGATIVE
Nitrite, UA: NEGATIVE
POC,PROTEIN,UA: NEGATIVE
Spec Grav, UA: 1.02 (ref 1.010–1.025)
Urobilinogen, UA: 0.2 E.U./dL
pH, UA: 6 (ref 5.0–8.0)

## 2019-05-19 LAB — OB RESULTS CONSOLE GC/CHLAMYDIA: Gonorrhea: NEGATIVE

## 2019-05-19 NOTE — Patient Instructions (Signed)
Group B Streptococcus Infection During Pregnancy °Group B Streptococcus (GBS) is a type of bacteria that is often found in healthy people. It is commonly found in the rectum, vagina, and intestines. In people who are healthy and not pregnant, the bacteria rarely cause serious illness or complications. However, women who test positive for GBS during pregnancy can pass the bacteria to the baby during childbirth. This can cause serious infection in the baby after birth. °Women with GBS may also have infections during their pregnancy or soon after childbirth. The infections include urinary tract infections (UTIs) or infections of the uterus. GBS also increases a woman's risk of complications during pregnancy, such as early labor or delivery, miscarriage, or stillbirth. Routine testing for GBS is recommended for all pregnant women. °What are the causes? °This condition is caused by bacteria called Streptococcus agalactiae. °What increases the risk? °You may have a higher risk for GBS infection during pregnancy if you had one during a past pregnancy. °What are the signs or symptoms? °In most cases, GBS infection does not cause symptoms in pregnant women. If symptoms exist, they may include: °· Labor that starts before the 37th week of pregnancy. °· A UTI or bladder infection. This may cause a fever, frequent urination, or pain and burning during urination. °· Fever during labor. There can also be a rapid heartbeat in the mother or baby. °Rare but serious symptoms of a GBS infection in women include: °· Blood infection (septicemia). This may cause fever, chills, or confusion. °· Lung infection (pneumonia). This may cause fever, chills, cough, rapid breathing, chest pain, or difficulty breathing. °· Bone, joint, skin, or soft tissue infection. °How is this diagnosed? °You may be screened for GBS between week 35 and week 37 of pregnancy. If you have symptoms of preterm labor, you may be screened earlier. This condition is  diagnosed based on lab test results from: °· A swab of fluid from the vagina and rectum. °· A urine sample. °How is this treated? °This condition is treated with antibiotic medicine. Antibiotic medicine may be given: °· To you when you go into labor, or as soon as your water breaks. The medicines will continue until after you give birth. If you are having a cesarean delivery, you do not need antibiotics unless your water has broken. °· To your baby, if he or she requires treatment. Your health care provider will check your baby to decide if he or she needs antibiotics to prevent a serious infection. °Follow these instructions at home: °· Take over-the-counter and prescription medicines only as told by your health care provider. °· Take your antibiotic medicine as told by your health care provider. Do not stop taking the antibiotic even if you start to feel better. °· Keep all pre-birth (prenatal) visits and follow-up visits as told by your health care provider. This is important. °Contact a health care provider if: °· You have pain or burning when you urinate. °· You have to urinate more often than usual. °· You have a fever or chills. °· You develop a bad-smelling vaginal discharge. °Get help right away if: °· Your water breaks. °· You go into labor. °· You have severe pain in your abdomen. °· You have difficulty breathing. °· You have chest pain. °These symptoms may represent a serious problem that is an emergency. Do not wait to see if the symptoms will go away. Get medical help right away. Call your local emergency services (911 in the [place] Do not drive yourself to   the hospital. °Summary °· GBS is a type of bacteria that is common in healthy people. °· During pregnancy, colonization with GBS can cause serious complications for you or your baby. °· Your health care provider will screen you between 35 and 37 weeks of pregnancy to determine if you are colonized with GBS. °· If you are colonized with GBS during  pregnancy, your health care provider will recommend antibiotics through an IV during labor. °· After delivery, your baby will be evaluated for complications related to potential GBS infection and may require antibiotics to prevent a serious infection. °This information is not intended to replace advice given to you by your health care provider. Make sure you discuss any questions you have with your health care provider. °Document Revised: 10/27/2018 Document Reviewed: 10/27/2018 °Elsevier Patient Education © 2020 Elsevier Inc. ° °

## 2019-05-19 NOTE — Progress Notes (Signed)
ROB doing well. Feels good movement . U/s today for growth due to elevated BMI in pregnancy. Results reviewed. NST completed see below. GBS and cultures today. SVE1-2/50/-3. Will schedule induction at next visit.   NST INTERPRETATION: Reactive  Indications: elevated BMI in pregnancy  Baseline 130 Variability: moderate Accelerations:present Decelerations:absent Toco: no ctx  Impression: reactive  Plan: Follow up 1 wk.  Doreene Burke, CNM   Patient Name: Mallory English DOB: Apr 05, 1991 MRN: [id number] ULTRASOUND REPORT  Location: [facility] Date of Service: 05/19/2019   Indications:growth/afi Findings:  Mason Jim intrauterine pregnancy is visualized with FHR at 137 BPM. Biometrics give an (U/S) Gestational age of [redacted]w[redacted]d and an (U/S) EDD of 06/11/2019; this correlates with the clinically established Estimated Date of Delivery: 06/15/19.   Fetal presentation is Cephalic.  Placenta: posterior. Grade: 2 AFI: 14.9 cm  Growth percentile is 52. EFW: 2938 g ( 6 lbs 8 oz)   Impression: 1. [redacted]w[redacted]d Viable Singleton Intrauterine pregnancy previously established criteria. 2. Growth is 52 %ile.  AFI is 14.9 cm.   Recommendations: 1.Clinical correlation with the patient's History and Physical Exam.   Jenine  M. Marciano Sequin     RDMS

## 2019-05-20 ENCOUNTER — Other Ambulatory Visit: Payer: Self-pay

## 2019-05-20 ENCOUNTER — Telehealth: Payer: Self-pay

## 2019-05-20 MED ORDER — ALBUTEROL SULFATE HFA 108 (90 BASE) MCG/ACT IN AERS: 2.0000 | INHALATION_SPRAY | RESPIRATORY_TRACT | 6 refills | Status: AC | PRN

## 2019-05-20 NOTE — Telephone Encounter (Signed)
mychart message sent to patient

## 2019-05-21 LAB — STREP GP B NAA: Strep Gp B NAA: NEGATIVE

## 2019-05-21 LAB — GC/CHLAMYDIA PROBE AMP
Chlamydia trachomatis, NAA: NEGATIVE
Neisseria Gonorrhoeae by PCR: NEGATIVE

## 2019-05-26 ENCOUNTER — Other Ambulatory Visit: Payer: 59

## 2019-05-26 ENCOUNTER — Encounter: Payer: Self-pay | Admitting: Certified Nurse Midwife

## 2019-05-26 ENCOUNTER — Other Ambulatory Visit: Payer: Self-pay

## 2019-05-26 ENCOUNTER — Ambulatory Visit (INDEPENDENT_AMBULATORY_CARE_PROVIDER_SITE_OTHER): Payer: 59 | Admitting: Certified Nurse Midwife

## 2019-05-26 VITALS — BP 116/72 | HR 104 | Wt 274.4 lb

## 2019-05-26 DIAGNOSIS — Z3482 Encounter for supervision of other normal pregnancy, second trimester: Secondary | ICD-10-CM

## 2019-05-26 DIAGNOSIS — Z3A37 37 weeks gestation of pregnancy: Secondary | ICD-10-CM

## 2019-05-26 DIAGNOSIS — O99213 Obesity complicating pregnancy, third trimester: Secondary | ICD-10-CM

## 2019-05-26 DIAGNOSIS — Z3483 Encounter for supervision of other normal pregnancy, third trimester: Secondary | ICD-10-CM

## 2019-05-26 LAB — POCT URINALYSIS DIPSTICK OB
Bilirubin, UA: NEGATIVE
Blood, UA: NEGATIVE
Glucose, UA: NEGATIVE
Ketones, UA: NEGATIVE
Leukocytes, UA: NEGATIVE
Nitrite, UA: NEGATIVE
POC,PROTEIN,UA: NEGATIVE
Spec Grav, UA: >=1.03 — AB (ref 1.010–1.025)
Urobilinogen, UA: 0.2 E.U./dL
pH, UA: 5 (ref 5.0–8.0)

## 2019-05-26 NOTE — Progress Notes (Signed)
NST-Patient started oral and vaginal primrose oil last night.    NONSTRESS TEST INTERPRETATION  INDICATIONS: Obesity  FHR baseline: 120, Accelerations present. Decelerations absent. Moderate variability  RESULTS:Reactive COMMENTS: Follow up NST & ROB 1 wk   PLAN: 1. Continue fetal kick counts twice a day. 2. Continue antepartum testing as scheduled-Biweekly  Doreene Burke, CNM

## 2019-05-26 NOTE — Progress Notes (Signed)
Body mass index is 44.29 kg/m. ROB doing well. Feels good movement . NST today. Discussed induction . Scheduled for 06/10/98 @ midnight. Reviewed Covid testing. She verbalizes understanding. Follow up 1 wk for NST and ROB with me.   Pattricia Boss Kynisha Memon< CNM

## 2019-05-26 NOTE — Patient Instructions (Signed)

## 2019-06-02 ENCOUNTER — Other Ambulatory Visit: Payer: 59

## 2019-06-02 ENCOUNTER — Other Ambulatory Visit: Payer: Self-pay

## 2019-06-02 ENCOUNTER — Ambulatory Visit (INDEPENDENT_AMBULATORY_CARE_PROVIDER_SITE_OTHER): Payer: 59 | Admitting: Certified Nurse Midwife

## 2019-06-02 VITALS — BP 103/68 | Wt 273.0 lb

## 2019-06-02 DIAGNOSIS — O99213 Obesity complicating pregnancy, third trimester: Secondary | ICD-10-CM

## 2019-06-02 DIAGNOSIS — Z3482 Encounter for supervision of other normal pregnancy, second trimester: Secondary | ICD-10-CM

## 2019-06-02 DIAGNOSIS — Z3A38 38 weeks gestation of pregnancy: Secondary | ICD-10-CM

## 2019-06-02 LAB — POCT URINALYSIS DIPSTICK OB
Bilirubin, UA: NEGATIVE
Blood, UA: NEGATIVE
Glucose, UA: NEGATIVE
Ketones, UA: NEGATIVE
Leukocytes, UA: NEGATIVE
Nitrite, UA: NEGATIVE
POC,PROTEIN,UA: NEGATIVE
Spec Grav, UA: 1.015 (ref 1.010–1.025)
Urobilinogen, UA: 0.2 E.U./dL
pH, UA: 5 (ref 5.0–8.0)

## 2019-06-02 NOTE — Progress Notes (Signed)
ROB and NST for elevated BMI  NST INTERPRETATION: reactive    Indications: elevated BMI in pregnancy   Baseline:130 Moderate Variability Accelerations :present Decelerations: absent  Toco: irritability   Impression: reactive   Plan: Induction [facility] on 2/25, Covid testing 2/22.     Doreene Burke, CNM

## 2019-06-02 NOTE — Patient Instructions (Signed)
Braxton Hicks Contractions °Contractions of the uterus can occur throughout pregnancy, but they are not always a sign that you are in labor. You may have practice contractions called Braxton Hicks contractions. These false labor contractions are sometimes confused with true labor. °What are Braxton Hicks contractions? °Braxton Hicks contractions are tightening movements that occur in the muscles of the uterus before labor. Unlike true labor contractions, these contractions do not result in opening (dilation) and thinning of the cervix. Toward the end of pregnancy (32-34 weeks), Braxton Hicks contractions can happen more often and may become stronger. These contractions are sometimes difficult to tell apart from true labor because they can be very uncomfortable. You should not feel embarrassed if you go to the hospital with false labor. °Sometimes, the only way to tell if you are in true labor is for your health care provider to look for changes in the cervix. The health care provider will do a physical exam and may monitor your contractions. If you are not in true labor, the exam should show that your cervix is not dilating and your water has not broken. °If there are no other health problems associated with your pregnancy, it is completely safe for you to be sent home with false labor. You may continue to have Braxton Hicks contractions until you go into true labor. °How to tell the difference between true labor and false labor °True labor °· Contractions last 30-70 seconds. °· Contractions become very regular. °· Discomfort is usually felt in the top of the uterus, and it spreads to the lower abdomen and low back. °· Contractions do not go away with walking. °· Contractions usually become more intense and increase in frequency. °· The cervix dilates and gets thinner. °False labor °· Contractions are usually shorter and not as strong as true labor contractions. °· Contractions are usually irregular. °· Contractions  are often felt in the front of the lower abdomen and in the groin. °· Contractions may go away when you walk around or change positions while lying down. °· Contractions get weaker and are shorter-lasting as time goes on. °· The cervix usually does not dilate or become thin. °Follow these instructions at home: ° °· Take over-the-counter and prescription medicines only as told by your health care provider. °· Keep up with your usual exercises and follow other instructions from your health care provider. °· Eat and drink lightly if you think you are going into labor. °· If Braxton Hicks contractions are making you uncomfortable: °? Change your position from lying down or resting to walking, or change from walking to resting. °? Sit and rest in a tub of warm water. °? Drink enough fluid to keep your urine pale yellow. Dehydration may cause these contractions. °? Do slow and deep breathing several times an hour. °· Keep all follow-up prenatal visits as told by your health care provider. This is important. °Contact a health care provider if: °· You have a fever. °· You have continuous pain in your abdomen. °Get help right away if: °· Your contractions become stronger, more regular, and closer together. °· You have fluid leaking or gushing from your vagina. °· You pass blood-tinged mucus (bloody show). °· You have bleeding from your vagina. °· You have low back pain that you never had before. °· You feel your baby’s head pushing down and causing pelvic pressure. °· Your baby is not moving inside you as much as it used to. °Summary °· Contractions that occur before labor are   called Braxton Hicks contractions, false labor, or practice contractions. °· Braxton Hicks contractions are usually shorter, weaker, farther apart, and less regular than true labor contractions. True labor contractions usually become progressively stronger and regular, and they become more frequent. °· Manage discomfort from Braxton Hicks contractions  by changing position, resting in a warm bath, drinking plenty of water, or practicing deep breathing. °This information is not intended to replace advice given to you by your health care provider. Make sure you discuss any questions you have with your health care provider. °Document Revised: 03/15/2017 Document Reviewed: 08/16/2016 °Elsevier Patient Education © 2020 [employer] ° °

## 2019-06-08 ENCOUNTER — Other Ambulatory Visit
Admission: RE | Admit: 2019-06-08 | Discharge: 2019-06-08 | Disposition: A | Discharge status: Home / Self Care | Payer: 59 | Source: Ambulatory Visit | Attending: Certified Nurse Midwife | Admitting: Certified Nurse Midwife

## 2019-06-08 LAB — SARS CORONAVIRUS 2 (TAT 6-24 HRS): SARS Coronavirus 2: NEGATIVE

## 2019-06-10 ENCOUNTER — Other Ambulatory Visit: Payer: Self-pay | Admitting: Certified Nurse Midwife

## 2019-06-11 ENCOUNTER — Other Ambulatory Visit: Payer: Self-pay

## 2019-06-11 ENCOUNTER — Inpatient Hospital Stay
Admission: EM | Admit: 2019-06-11 | Discharge: 2019-06-12 | DRG: 807 | Disposition: A | Discharge status: Home / Self Care | Payer: 59 | Attending: Certified Nurse Midwife | Admitting: Certified Nurse Midwife

## 2019-06-11 ENCOUNTER — Inpatient Hospital Stay: Payer: 59 | Admitting: Anesthesiology

## 2019-06-11 ENCOUNTER — Encounter: Payer: Self-pay | Admitting: Obstetrics and Gynecology

## 2019-06-11 DIAGNOSIS — O322XX0 Maternal care for transverse and oblique lie, not applicable or unspecified: Secondary | ICD-10-CM | POA: Diagnosis present

## 2019-06-11 DIAGNOSIS — Z20822 Contact with and (suspected) exposure to covid-19: Secondary | ICD-10-CM | POA: Diagnosis present

## 2019-06-11 DIAGNOSIS — O9952 Diseases of the respiratory system complicating childbirth: Secondary | ICD-10-CM | POA: Diagnosis present

## 2019-06-11 DIAGNOSIS — O99214 Obesity complicating childbirth: Principal | ICD-10-CM | POA: Diagnosis present

## 2019-06-11 DIAGNOSIS — J4520 Mild intermittent asthma, uncomplicated: Secondary | ICD-10-CM | POA: Diagnosis present

## 2019-06-11 DIAGNOSIS — Z3A39 39 weeks gestation of pregnancy: Secondary | ICD-10-CM

## 2019-06-11 LAB — CBC
HCT: 29.9 % — ABNORMAL LOW (ref 36.0–46.0)
Hemoglobin: 9.6 g/dL — ABNORMAL LOW (ref 12.0–15.0)
MCH: 26.2 pg (ref 26.0–34.0)
MCHC: 32.1 g/dL (ref 30.0–36.0)
MCV: 81.7 fL (ref 80.0–100.0)
Platelets: 300 10*3/uL (ref 150–400)
RBC: 3.66 MIL/uL — ABNORMAL LOW (ref 3.87–5.11)
RDW: 14.7 % (ref 11.5–15.5)
WBC: 10.5 10*3/uL (ref 4.0–10.5)
nRBC: 0 % (ref 0.0–0.2)

## 2019-06-11 LAB — RPR: RPR Ser Ql: NONREACTIVE

## 2019-06-11 LAB — TYPE AND SCREEN
ABO/RH(D): O POS
Antibody Screen: NEGATIVE

## 2019-06-11 LAB — ABO/RH: ABO/RH(D): O POS

## 2019-06-11 MED ORDER — COCONUT OIL OIL
1.0000 "application " | TOPICAL_OIL | Status: DC | PRN
  Filled 2019-06-11: qty 120

## 2019-06-11 MED ORDER — PHENYLEPHRINE 40 MCG/ML (10ML) SYRINGE FOR IV PUSH (FOR BLOOD PRESSURE SUPPORT)
80.0000 ug | PREFILLED_SYRINGE | INTRAVENOUS | Status: DC | PRN
  Filled 2019-06-11: qty 10

## 2019-06-11 MED ORDER — DIPHENHYDRAMINE HCL 50 MG/ML IJ SOLN: 12.5000 mg | INTRAMUSCULAR | Status: DC | PRN

## 2019-06-11 MED ORDER — FENTANYL 2.5 MCG/ML W/ROPIVACAINE 0.15% IN NS 100 ML EPIDURAL (ARMC)
12.0000 mL/h | EPIDURAL | Status: DC
  Administered 2019-06-11: 12 mL/h via EPIDURAL

## 2019-06-11 MED ORDER — DIBUCAINE (PERIANAL) 1 % EX OINT: 1.0000 "application " | TOPICAL_OINTMENT | CUTANEOUS | Status: DC | PRN

## 2019-06-11 MED ORDER — LIDOCAINE HCL (PF) 1 % IJ SOLN
INTRAMUSCULAR | Status: AC
  Filled 2019-06-11: qty 30

## 2019-06-11 MED ORDER — OXYCODONE-ACETAMINOPHEN 5-325 MG PO TABS: 1.0000 | ORAL_TABLET | ORAL | Status: DC | PRN

## 2019-06-11 MED ORDER — LIDOCAINE HCL (PF) 1 % IJ SOLN: 30.0000 mL | INTRAMUSCULAR | Status: DC | PRN

## 2019-06-11 MED ORDER — SOD CITRATE-CITRIC ACID 500-334 MG/5ML PO SOLN: 30.0000 mL | ORAL | Status: DC | PRN

## 2019-06-11 MED ORDER — OXYCODONE-ACETAMINOPHEN 5-325 MG PO TABS: 2.0000 | ORAL_TABLET | ORAL | Status: DC | PRN

## 2019-06-11 MED ORDER — ACETAMINOPHEN 325 MG PO TABS: 650.0000 mg | ORAL_TABLET | ORAL | Status: DC | PRN

## 2019-06-11 MED ORDER — FENTANYL 2.5 MCG/ML W/ROPIVACAINE 0.15% IN NS 100 ML EPIDURAL (ARMC)
EPIDURAL | Status: AC
  Filled 2019-06-11: qty 100

## 2019-06-11 MED ORDER — OXYTOCIN 40 UNITS IN NORMAL SALINE INFUSION - SIMPLE MED
1.0000 m[IU]/min | INTRAVENOUS | Status: DC
  Administered 2019-06-11: 10:00:00 4 m[IU]/min via INTRAVENOUS

## 2019-06-11 MED ORDER — OXYTOCIN 10 UNIT/ML IJ SOLN
INTRAMUSCULAR | Status: AC
  Filled 2019-06-11: qty 2

## 2019-06-11 MED ORDER — IBUPROFEN 600 MG PO TABS
600.0000 mg | ORAL_TABLET | Freq: Four times a day (QID) | ORAL | Status: DC
  Administered 2019-06-11 – 2019-06-12 (×5): 600 mg via ORAL
  Filled 2019-06-11 (×5): qty 1

## 2019-06-11 MED ORDER — AMMONIA AROMATIC IN INHA
RESPIRATORY_TRACT | Status: AC
  Filled 2019-06-11: qty 10

## 2019-06-11 MED ORDER — ONDANSETRON HCL 4 MG PO TABS: 4.0000 mg | ORAL_TABLET | ORAL | Status: DC | PRN

## 2019-06-11 MED ORDER — WITCH HAZEL-GLYCERIN EX PADS: 1.0000 "application " | MEDICATED_PAD | CUTANEOUS | Status: DC | PRN

## 2019-06-11 MED ORDER — METHYLERGONOVINE MALEATE 0.2 MG/ML IJ SOLN: 0.2000 mg | INTRAMUSCULAR | Status: DC | PRN

## 2019-06-11 MED ORDER — PRENATAL MULTIVITAMIN CH
1.0000 | ORAL_TABLET | Freq: Every day | ORAL | Status: DC
  Administered 2019-06-11 – 2019-06-12 (×2): 1 via ORAL
  Filled 2019-06-11 (×2): qty 1

## 2019-06-11 MED ORDER — SENNOSIDES-DOCUSATE SODIUM 8.6-50 MG PO TABS
2.0000 | ORAL_TABLET | ORAL | Status: DC
  Administered 2019-06-11: 2 via ORAL
  Filled 2019-06-11: qty 2

## 2019-06-11 MED ORDER — OXYTOCIN BOLUS FROM INFUSION
500.0000 mL | Freq: Once | INTRAVENOUS | Status: AC
  Administered 2019-06-11: 500 mL via INTRAVENOUS

## 2019-06-11 MED ORDER — DOCUSATE SODIUM 100 MG PO CAPS
100.0000 mg | ORAL_CAPSULE | Freq: Two times a day (BID) | ORAL | Status: DC
  Administered 2019-06-11 – 2019-06-12 (×2): 100 mg via ORAL
  Filled 2019-06-11 (×2): qty 1

## 2019-06-11 MED ORDER — MISOPROSTOL 50MCG HALF TABLET
50.0000 ug | ORAL_TABLET | ORAL | Status: DC
  Administered 2019-06-11 (×2): 50 ug via VAGINAL
  Filled 2019-06-11 (×2): qty 1

## 2019-06-11 MED ORDER — BUPIVACAINE HCL (PF) 0.25 % IJ SOLN
INTRAMUSCULAR | Status: DC | PRN
  Administered 2019-06-11: 4 mL via EPIDURAL
  Administered 2019-06-11: 3 mL via EPIDURAL

## 2019-06-11 MED ORDER — ONDANSETRON HCL 4 MG/2ML IJ SOLN
4.0000 mg | Freq: Four times a day (QID) | INTRAMUSCULAR | Status: DC | PRN
  Administered 2019-06-11: 4 mg via INTRAVENOUS
  Filled 2019-06-11: qty 2

## 2019-06-11 MED ORDER — METHYLERGONOVINE MALEATE 0.2 MG PO TABS
0.2000 mg | ORAL_TABLET | ORAL | Status: DC | PRN
  Filled 2019-06-11: qty 1

## 2019-06-11 MED ORDER — LIDOCAINE HCL (PF) 1 % IJ SOLN
INTRAMUSCULAR | Status: DC | PRN
  Administered 2019-06-11: 3 mL via SUBCUTANEOUS

## 2019-06-11 MED ORDER — EPHEDRINE 5 MG/ML INJ
10.0000 mg | INTRAVENOUS | Status: DC | PRN
  Filled 2019-06-11: qty 2

## 2019-06-11 MED ORDER — LACTATED RINGERS IV SOLN: INTRAVENOUS | Status: DC

## 2019-06-11 MED ORDER — OXYTOCIN 40 UNITS IN NORMAL SALINE INFUSION - SIMPLE MED
2.5000 [IU]/h | INTRAVENOUS | Status: DC
  Filled 2019-06-11: qty 1000

## 2019-06-11 MED ORDER — LIDOCAINE-EPINEPHRINE (PF) 1.5 %-1:200000 IJ SOLN
INTRAMUSCULAR | Status: DC | PRN
  Administered 2019-06-11: 3 mL via EPIDURAL

## 2019-06-11 MED ORDER — MISOPROSTOL 200 MCG PO TABS
ORAL_TABLET | ORAL | Status: AC
  Filled 2019-06-11: qty 4

## 2019-06-11 MED ORDER — TERBUTALINE SULFATE 1 MG/ML IJ SOLN: 0.2500 mg | Freq: Once | INTRAMUSCULAR | Status: DC | PRN

## 2019-06-11 MED ORDER — FERROUS SULFATE 325 (65 FE) MG PO TABS
325.0000 mg | ORAL_TABLET | Freq: Every day | ORAL | Status: DC
  Administered 2019-06-12: 325 mg via ORAL
  Filled 2019-06-11: qty 1

## 2019-06-11 MED ORDER — BUTORPHANOL TARTRATE 1 MG/ML IJ SOLN
1.0000 mg | INTRAMUSCULAR | Status: DC | PRN
  Administered 2019-06-11 (×2): 1 mg via INTRAVENOUS
  Filled 2019-06-11 (×2): qty 1

## 2019-06-11 MED ORDER — SIMETHICONE 80 MG PO CHEW: 80.0000 mg | CHEWABLE_TABLET | ORAL | Status: DC | PRN

## 2019-06-11 MED ORDER — BENZOCAINE-MENTHOL 20-0.5 % EX AERO
1.0000 "application " | INHALATION_SPRAY | CUTANEOUS | Status: DC | PRN
  Filled 2019-06-11: qty 56

## 2019-06-11 MED ORDER — LACTATED RINGERS IV SOLN: 500.0000 mL | INTRAVENOUS | Status: DC | PRN

## 2019-06-11 MED ORDER — ONDANSETRON HCL 4 MG/2ML IJ SOLN: 4.0000 mg | INTRAMUSCULAR | Status: DC | PRN

## 2019-06-11 MED ORDER — LACTATED RINGERS IV SOLN: 500.0000 mL | Freq: Once | INTRAVENOUS | Status: DC

## 2019-06-11 NOTE — Progress Notes (Signed)
Pt presents to L&D for IOL for elevated BMI.

## 2019-06-11 NOTE — Anesthesia Procedure Notes (Signed)
Epidural Patient location during procedure: OB Start time: 06/11/2019 10:20 AM End time: 06/11/2019 10:30 AM  Staffing Anesthesiologist: Piscitello, Cleda Mccreedy, MD Resident/CRNA: Karoline Caldwell, CRNA Performed: resident/CRNA   Preanesthetic Checklist Completed: patient identified, IV checked, site marked, risks and benefits discussed, surgical consent, monitors and equipment checked, pre-op evaluation and timeout performed  Epidural Patient position: sitting Prep: ChloraPrep Patient monitoring: heart rate, continuous pulse ox and blood pressure Approach: midline Location: L3-L4 Injection technique: LOR saline  Needle:  Needle type: Tuohy  Needle gauge: 17 G Needle length: 9 cm and 9 Needle insertion depth: 8 cm Catheter type: closed end flexible Catheter size: 19 Gauge Catheter at skin depth: 12 cm Test dose: negative and 1.5% lidocaine with Epi 1:200 K  Assessment Events: blood not aspirated, injection not painful, no injection resistance, no paresthesia and negative IV test  Additional Notes 1 attempt Pt. Evaluated and documentation done after procedure finished. Patient identified. Risks/Benefits/Options discussed with patient including but not limited to bleeding, infection, nerve damage, paralysis, failed block, incomplete pain control, headache, blood pressure changes, nausea, vomiting, reactions to medication both or allergic, itching and postpartum back pain. Confirmed with bedside nurse the patient's most recent platelet count. Confirmed with patient that they are not currently taking any anticoagulation, have any bleeding history or any family history of bleeding disorders. Patient expressed understanding and wished to proceed. All questions were answered. Sterile technique was used throughout the entire procedure. Please see nursing notes for vital signs. Test dose was given through epidural catheter and negative prior to continuing to dose epidural or start infusion.  Warning signs of high block given to the patient including shortness of breath, tingling/numbness in hands, complete motor block, or any concerning symptoms with instructions to call for help. Patient was given instructions on fall risk and not to get out of bed. All questions and concerns addressed with instructions to call with any issues or inadequate analgesia.   Patient tolerated the insertion well without immediate complications.Reason for block:procedure for pain

## 2019-06-11 NOTE — Progress Notes (Signed)
LABOR NOTE   Mallory English 29 y.o.@ at [redacted]w[redacted]d  SUBJECTIVE:  Pt is feeling some mild cramping discomfort.  Analgesia: Labor support without medications  OBJECTIVE:  BP (!) 106/51 (BP Location: Left Arm)   Pulse 75   Temp 98.1 F (36.7 C) (Oral)   Resp 17   Ht 5\' 6"  (1.676 m)   Wt 120.2 kg   LMP 09/01/2018 (Exact Date)   BMI 42.77 kg/m  Total I/O In: 891.9 [I.V.:891.9] Out: -   She has shown cervical change. CERVIX: 4cm:  70%:   -2:   mid position:   soft SVE:   Dilation: 4 Effacement (%): 70 Station: -2 Exam by:: A Jaquay Morneault CONTRACTIONS: regular, every 2-4 minutes FHR: Fetal heart tracing reviewed. Baseline: 130 bpm, Variability: Good {> 6 bpm), Accelerations: Reactive and Decelerations: Absent Category I    Labs: Lab Results  Component Value Date   WBC 10.5 06/11/2019   HGB 9.6 (L) 06/11/2019   HCT 29.9 (L) 06/11/2019   MCV 81.7 06/11/2019   PLT 300 06/11/2019    ASSESSMENT: 1) Labor curve reviewed.       Progress: Early latent labor.     Membranes: ruptured, clear fluid       Active Problems:   Labor and delivery, indication for care   PLAN: IV Pitocin augmentation and AROM, clear fluid   06/13/2019, CNM  06/11/2019 8:15 AM

## 2019-06-11 NOTE — H&P (Signed)
History and Physical   HPI  Mallory English is a 29 y.o. G2P1001 at [redacted]w[redacted]d Estimated Date of Delivery: 06/15/19 who is being admitted for induction of labor due to elevated BMI in pregnancy .    OB History  OB History  Gravida Para Term Preterm AB Living  2 1 1  0 0 1  SAB TAB Ectopic Multiple Live Births  0 0 0 0 1    # Outcome Date GA Lbr Len/2nd Weight Sex Delivery Anes PTL Lv  2 Current           1 Term 08/16/15 [redacted]w[redacted]d  4026 g M Vag-Spont  N LIV    PROBLEM LIST  Pregnancy complications or risks: Patient Active Problem List   Diagnosis Date Noted  . Labor and delivery, indication for care 06/11/2019  . Mild intermittent asthma without complication 02/13/2018  . Obesity, Class III, BMI 40-49.9 (morbid obesity) (H[facility]) 02/13/2018  . Anxiety 02/13/2018    Prenatal labs and studies: ABO, Rh: O/Positive/-- (07/27 1033) Antibody: Negative (07/27 1033) Rubella: 4.50 (07/27 1033) RPR: Non Reactive (12/01 1046)  HBsAg: Negative (07/27 1033)  HIV: Non Reactive (07/27 1033)  GBS:--/Negative (02/02 1526)   Past Medical History:  Diagnosis Date  . Asthma      Past Surgical History:  Procedure Laterality Date  . APPENDECTOMY       Medications    Current Discharge Medication List    CONTINUE these medications which have NOT CHANGED   Details  Prenatal Vit-Fe Fumarate-FA (PRENATAL MULTIVITAMIN) TABS tablet Take 1 tablet by mouth daily at 12 noon.    albuterol (VENTOLIN HFA) 108 (90 Base) MCG/ACT inhaler Inhale 2 puffs into the lungs every 4 (four) hours as needed for wheezing or shortness of breath. Qty: 6.7 g, Refills: 6         Allergies  Patient has no known allergies.  Review of Systems  Constitutional: negative Eyes: negative Ears, nose, mouth, throat, and face: negative Respiratory: negative Cardiovascular: negative Gastrointestinal: negative Genitourinary:negative Integument/breast: negative Hematologic/lymphatic:  negative Musculoskeletal:negative Neurological: negative Behavioral/Psych: negative Endocrine: negative Allergic/Immunologic: negative  Physical Exam  BP 102/78 (BP Location: Left Arm)   Pulse (!) 110   Temp 98.2 F (36.8 C) (Oral)   Resp 16   Ht 5\' 6"  (1.676 m)   Wt 120.2 kg   LMP 09/01/2018 (Exact Date)   BMI 42.77 kg/m   Lungs:  CTA B Cardio: RRR  Abd: Soft, gravid, NT Presentation: cephalic EXT: No C/C/ 1+ Edema DTRs: 2+ B CERVIX: Dilation: 2.5 Effacement (%): 60 Station: -2 Presentation: Vertex Exam by:: A 09/03/2018 CNM  See Prenatal records for more detailed PE.     FHR:  Baseline: 130 bpm, Variability: Good {> 6 bpm), Accelerations: Reactive and Decelerations: Absent  Toco: Uterine Contractions: None  Test Results  Results for orders placed or performed during the hospital encounter of 06/11/19 (from the past 24 hour(s))  CBC     Status: Abnormal   Collection Time: 06/11/19 12:34 AM  Result Value Ref Range   WBC 10.5 4.0 - 10.5 K/uL   RBC 3.66 (L) 3.87 - 5.11 MIL/uL   Hemoglobin 9.6 (L) 12.0 - 15.0 g/dL   HCT 06/13/19 (L) 06/13/19 - 82.4 %   MCV 81.7 80.0 - 100.0 fL   MCH 26.2 26.0 - 34.0 pg   MCHC 32.1 30.0 - 36.0 g/dL   RDW 23.5 36.1 - 44.3 %   Platelets 300 150 - 400 K/uL   nRBC  0.0 0.0 - 0.2 %   Group B Strep negative  Assessment   G2P1001 at [redacted]w[redacted]d Estimated Date of Delivery: 06/15/19  The fetus is reassuring.   Patient Active Problem List   Diagnosis Date Noted  . Labor and delivery, indication for care 06/11/2019  . Mild intermittent asthma without complication 73/42/8768  . Obesity, Class III, BMI 40-49.9 (morbid obesity) ([place]) 02/13/2018  . Anxiety 02/13/2018    Plan  1. Admit to L&D :   2. EFM:-- Category 1 3. Stadol or Epidural if desired.   4. Admission labs  5. Anticipate NSVD  Philip Aspen, CNM  06/11/2019 1:13 AM

## 2019-06-11 NOTE — Lactation Note (Signed)
This note was copied from a baby's chart. Lactation Consultation Note  Patient Name: Mallory English Date: 06/11/2019   [facility] spoke with mom briefly once moved to [facility] before end of shift. This is mom's second baby. Mom reports hx of flat/inverted nipples and latching difficulty with her first baby. She exclusively pumped, experienced over supply, and ended up donating over 2000oz to milk bank. Mom says that time spent pump was able to draw out right nipple, and enough of left nipple to allow this baby the ability to latch. Baby [name] is an LGA with stable first sugar. Mom and transition RN report that feedings after delivery went well, audible swallows, nice deep latch, and no pain or discomfort. LC reviewed newborn feeding patterns, stomach sizes, early hunger cues, and void/stool expectations for first 24 hours. Encouraged her to seek breastfeeding support overnight as needed, and that lactation support would be back in the morning. Lactation number is written on whiteboard. Mom had no questions/concerns at this time.  Maternal Data    Feeding Feeding Type: Breast Fed  LATCH Score Latch: Grasps breast easily, tongue down, lips flanged, rhythmical sucking.  Audible Swallowing: Spontaneous and intermittent  Type of Nipple: Everted at rest and after stimulation  Comfort (Breast/Nipple): Soft / non-tender  Hold (Positioning): No assistance needed to correctly position infant at breast.  LATCH Score: 10  Interventions Interventions: Breast feeding basics reviewed  Lactation Tools Discussed/Used     Consult Status      Danford Bad 06/11/2019, 4:41 PM

## 2019-06-11 NOTE — Anesthesia Preprocedure Evaluation (Signed)
Anesthesia Evaluation  Patient identified by MRN, date of birth, ID band Patient awake    Reviewed: Allergy & Precautions, H&P , NPO status , Patient's Chart, lab work & pertinent test results  Airway Mallampati: II       Dental  (+) Teeth Intact   Pulmonary asthma (uses inhaler PRN) ,    Pulmonary exam normal        Cardiovascular negative cardio ROS Normal cardiovascular exam     Neuro/Psych Anxiety negative neurological ROS     GI/Hepatic GERD (Takes TUMS PRN)  Medicated,  Endo/Other  negative endocrine ROS  Renal/GU negative Renal ROS  negative genitourinary   Musculoskeletal   Abdominal   Peds  Hematology negative hematology ROS (+)   Anesthesia Other Findings   Reproductive/Obstetrics (+) Pregnancy                             Anesthesia Physical Anesthesia Plan  ASA: II  Anesthesia Plan: Epidural   Post-op Pain Management:    Induction:   PONV Risk Score and Plan:   Airway Management Planned:   Additional Equipment:   Intra-op Plan:   Post-operative Plan:   Informed Consent: I have reviewed the patients History and Physical, chart, labs and discussed the procedure including the risks, benefits and alternatives for the proposed anesthesia with the patient or authorized representative who has indicated his/her understanding and acceptance.     Dental Advisory Given  Plan Discussed with: Anesthesiologist and CRNA  Anesthesia Plan Comments:         Anesthesia Quick Evaluation

## 2019-06-11 NOTE — Progress Notes (Signed)
Pt requesting epidural for pain management at 1005 and Anesthesia was notified. Epidural was placed at 1021 and foley catheter was placed once patient was comfortable. RN then positioned pt in Right exaggerated Sims with Birthing ball and adjusted [place] and Toco. Pt resting comfortably at this time. CNM notified of fetal heart rate change in baseline to 115 with moderate variability and difficulty tracing ctx. CNM will be at bedside within the hour to evaluate vaginal exam and uterine activity.

## 2019-06-11 NOTE — Progress Notes (Signed)
   06/11/19 0950  Clinical Encounter Type  Visited With Patient and family together  Visit Type Initial  Referral From Chaplain  Consult/Referral To Chaplain  Chaplain encountered patient while she was in labor. Chaplain asked about AD and patient said yes. Chaplain handed AD to father of the baby. Chaplain asked patient how she was feeling and she said he knows that I do not want be touch. The father said that is why I am sitting over there, pointing to seat by the window.  Chaplain wished them both well and left.

## 2019-06-12 ENCOUNTER — Ambulatory Visit: Payer: Self-pay

## 2019-06-12 LAB — CBC
HCT: 29.3 % — ABNORMAL LOW (ref 36.0–46.0)
Hemoglobin: 9.3 g/dL — ABNORMAL LOW (ref 12.0–15.0)
MCH: 26.4 pg (ref 26.0–34.0)
MCHC: 31.7 g/dL (ref 30.0–36.0)
MCV: 83.2 fL (ref 80.0–100.0)
Platelets: 246 K/uL (ref 150–400)
RBC: 3.52 MIL/uL — ABNORMAL LOW (ref 3.87–5.11)
RDW: 14.9 % (ref 11.5–15.5)
WBC: 10.7 K/uL — ABNORMAL HIGH (ref 4.0–10.5)
nRBC: 0 % (ref 0.0–0.2)

## 2019-06-12 MED ORDER — FERROUS SULFATE 325 (65 FE) MG PO TABS: 325.0000 mg | ORAL_TABLET | Freq: Every day | ORAL | 3 refills | Status: DC

## 2019-06-12 MED ORDER — WITCH HAZEL-GLYCERIN EX PADS: 1.0000 "application " | MEDICATED_PAD | CUTANEOUS | 12 refills | Status: DC | PRN

## 2019-06-12 MED ORDER — IBUPROFEN 600 MG PO TABS: 600.0000 mg | ORAL_TABLET | Freq: Four times a day (QID) | ORAL | 0 refills | Status: DC

## 2019-06-12 MED ORDER — DOCUSATE SODIUM 100 MG PO CAPS: 100.0000 mg | ORAL_CAPSULE | Freq: Two times a day (BID) | ORAL | 0 refills | Status: DC

## 2019-06-12 MED ORDER — BENZOCAINE-MENTHOL 20-0.5 % EX AERO: 1.0000 "application " | INHALATION_SPRAY | CUTANEOUS | 1 refills | Status: DC | PRN

## 2019-06-12 MED ORDER — COCONUT OIL OIL: 1.0000 "application " | TOPICAL_OIL | 0 refills | Status: DC | PRN

## 2019-06-12 NOTE — Progress Notes (Signed)
Patient discharged home with infant. Discharge instructions and prescriptions given and reviewed with patient. Patient verbalized understanding. Escorted out by auxillary.  

## 2019-06-12 NOTE — Lactation Note (Addendum)
This note was copied from a baby's chart. Lactation Consultation Note  Patient Name: Mallory English BOERQ'S Date: 06/12/2019  Baby getting 1 st  [place], mom states that baby has been sleepy, but last feeding was better, latching easily, I did not observe a feeding, mom and baby for d/c today, LC name and phone no. written on white board for mom to call before d/c if she would like LC to observe the feeding.  This is mom's 2nd baby and  She provided breast milk for first child x 13 mths  Maternal Data    Feeding Feeding Type: Breast Fed  LATCH Score                   Interventions    Lactation Tools Discussed/Used     Consult Status      Dyann Kief 06/12/2019, 3:42 PM

## 2019-06-12 NOTE — Anesthesia Postprocedure Evaluation (Signed)
Anesthesia Post Note  Patient: Mallory English  Procedure(s) Performed: AN AD HOC LABOR EPIDURAL  Patient location during evaluation: Mother Baby Anesthesia Type: Epidural Level of consciousness: awake and alert Pain management: pain level controlled Vital Signs Assessment: post-procedure vital signs reviewed and stable Respiratory status: spontaneous breathing, nonlabored ventilation and respiratory function stable Cardiovascular status: stable Postop Assessment: no headache, no backache and epidural receding Anesthetic complications: no     Last Vitals:  Vitals:   06/11/19 2325 06/12/19 0332  BP: 94/69 (!) 90/57  Pulse: 81 81  Resp:    Temp: 36.7 C 36.9 C  SpO2:  99%    Last Pain:  Vitals:   06/12/19 0332  TempSrc: Oral  PainSc:                  Clydene Pugh

## 2019-06-12 NOTE — Discharge Summary (Signed)
Physician Obstetric Discharge Summary  Patient ID: Mallory English MRN: [id number] DOB/AGE: [birth year] 28 y.o.   Date of Admission: 06/11/2019  Date of Discharge:   Admitting Diagnosis: Induction of labor at [redacted]w[redacted]d  Secondary Diagnosis: Obesity in Pregnancy  Mode of Delivery: normal spontaneous vaginal delivery     Discharge Diagnosis: No other diagnosis   Intrapartum Procedures: Atificial rupture of membranes, epidural and laceration 1st   Post partum procedures: None  Complications: First degree perineal laceration   Brief Hospital Course   Mallory English is a G2P1001 who had a SVD on 06/11/2019;  for further details of this surgery, please refer to the delivey note.  Patient had an uncomplicated postpartum course.  By time of discharge on PPD#1, her pain was controlled on oral pain medications; she had appropriate lochia and was ambulating, voiding without difficulty and tolerating regular diet.  She was deemed stable for discharge to home.    Labs: CBC Latest Ref Rng & Units 06/12/2019 06/11/2019 03/31/2019  WBC 4.0 - 10.5 K/uL 10.7(H) 10.5 10.4  Hemoglobin 12.0 - 15.0 g/dL 9.3(L) 9.6(L) 11.5(L)  Hematocrit 36.0 - 46.0 % 29.3(L) 29.9(L) 33.4(L)  Platelets 150 - 400 K/uL 246 300 298   O POS Performed at [facility] [address] [place], [place] [address]   Physical exam:   Temp:  [97.9 F (36.6 C)-98.5 F (36.9 C)] 98 F (36.7 C) (02/26 0800) Pulse Rate:  [77-91] 79 (02/26 0800) Resp:  [17-19] 19 (02/26 0800) BP: (90-117)/(48-71) 117/64 (02/26 0800) SpO2:  [98 %-99 %] 99 % (02/26 0800)  General: alert and no distress  Lochia: appropriate  Abdomen: soft, NT  Uterine Fundus: firm  Laceration: healing well, no significant drainage, no dehiscence, no significant erythema  Extremities: No evidence of DVT seen on physical exam. No lower extremity edema.  Edinburgh Postnatal Depression Scale Screening Tool 06/12/2019 06/11/2019  I have been able to laugh  and see the funny side of things. 0 (No Data)  I have looked forward with enjoyment to things. 0 -  I have blamed myself unnecessarily when things went wrong. 1 -  I have been anxious or worried for no good reason. 1 -  I have felt scared or panicky for no good reason. 1 -  Things have been getting on top of me. 1 -  I have been so unhappy that I have had difficulty sleeping. 0 -  I have felt sad or miserable. 1 -  I have been so unhappy that I have been crying. 1 -  The thought of harming myself has occurred to me. 0 -  Edinburgh Postnatal Depression Scale Total 6 -     Discharge Instructions: Per After Visit Summary.  Activity: Advance as tolerated. Pelvic rest for 6 weeks.  Also refer to After Visit Summary  Diet: Regular  Medications: Allergies as of 06/12/2019   No Known Allergies     Medication List    TAKE these medications   albuterol 108 (90 Base) MCG/ACT inhaler Commonly known as: VENTOLIN HFA Inhale 2 puffs into the lungs every 4 (four) hours as needed for wheezing or shortness of breath.   benzocaine-Menthol 20-0.5 % Aero Commonly known as: DERMOPLAST Apply 1 application topically as needed for irritation (perineal discomfort).   coconut oil Oil Apply 1 application topically as needed.   docusate sodium 100 MG capsule Commonly known as: COLACE Take 1 capsule (100 mg total) by mouth 2 (two) times daily.   ferrous sulfate 325 (65  FE) MG tablet Take 1 tablet (325 mg total) by mouth daily with breakfast. Start taking on: June 13, 2019   ibuprofen 600 MG tablet Commonly known as: ADVIL Take 1 tablet (600 mg total) by mouth every 6 (six) hours.   prenatal multivitamin Tabs tablet Take 1 tablet by mouth daily at 12 noon.   witch hazel-glycerin pad Commonly known as: TUCKS Apply 1 application topically as needed for hemorrhoids.      Outpatient follow up:  Follow-up Information    Doreene Burke, CNM Follow up.   Specialties: Certified Nurse  Midwife, Radiology Why: Someone from the office will call to schedule a two (2) week postpartum televisit and six (6) week postpartum visit Contact information: [address] [place] [address] [phone]          Postpartum contraception: IUD  Discharged Condition: stable  Discharged to: home   Newborn Data:  Disposition:home with mother  Apgars: APGAR (1 MIN): 8   APGAR (5 MINS): 9     Baby Feeding: Breast   Gunnar Bulla, CNM [facility], [facility] 06/12/19 1:53 PM

## 2019-06-12 NOTE — Discharge Instructions (Signed)

## 2019-06-24 ENCOUNTER — Encounter: Payer: 59 | Admitting: Certified Nurse Midwife

## 2019-07-27 ENCOUNTER — Other Ambulatory Visit: Payer: Self-pay

## 2019-07-27 ENCOUNTER — Ambulatory Visit (INDEPENDENT_AMBULATORY_CARE_PROVIDER_SITE_OTHER): Payer: 59 | Admitting: Certified Nurse Midwife

## 2019-07-27 ENCOUNTER — Encounter: Payer: Self-pay | Admitting: Certified Nurse Midwife

## 2019-07-27 MED ORDER — NORETHINDRONE 0.35 MG PO TABS: 1.0000 | ORAL_TABLET | Freq: Every day | ORAL | 11 refills | Status: DC

## 2019-07-27 NOTE — Patient Instructions (Signed)

## 2019-07-27 NOTE — Progress Notes (Signed)
Subjective:    Mallory English is a 29 y.o. G70P1001 Caucasian female who presents for a postpartum visit. She is 6 weeks postpartum following a spontaneous vaginal delivery at 39.3 gestational weeks. Anesthesia: epidural. I have fully reviewed the prenatal and intrapartum course. Postpartum course has been WNL. Baby's course has been WNL. Baby is feeding by breast. Bleeding no bleeding. Bowel function is normal. Bladder function is normal. Patient is not sexually active.  Contraception method is usure at this time considering IUD but request pop in the mean time. . Postpartum depression screening: negative. Score 0.  Last pap 12/02/2018 and was normal.   The following portions of the patient's history were reviewed and updated as appropriate: allergies, current medications, past medical history, past surgical history and problem list.  Review of Systems Pertinent items are noted in HPI.   There were no vitals filed for this visit. No LMP recorded.  Objective:   General:  alert, cooperative and no distress   Breasts:  deferred, no complaints  Lungs: clear to auscultation bilaterally  Heart:  regular rate and rhythm  Abdomen: soft, nontender   Vulva: normal  Vagina: normal vagina  Cervix:  closed  Corpus: Well-involuted  Adnexa:  Non-palpable  Rectal Exam: no hemorrhoids        Assessment:   Postpartum exam 6 wks s/p SVD Breastfeeding Depression screening Contraception counseling   Plan:  : oral progesterone-only contraceptive Follow up in: 1-2 wks for IUD placement or earlier if needed  Doreene Burke CNM

## 2019-11-03 ENCOUNTER — Ambulatory Visit
Admission: EM | Admit: 2019-11-03 | Discharge: 2019-11-03 | Disposition: A | Discharge status: Home / Self Care | Payer: 59 | Attending: Family Medicine | Admitting: Family Medicine

## 2019-11-03 DIAGNOSIS — R3915 Urgency of urination: Secondary | ICD-10-CM | POA: Diagnosis not present

## 2019-11-03 DIAGNOSIS — R300 Dysuria: Secondary | ICD-10-CM | POA: Insufficient documentation

## 2019-11-03 DIAGNOSIS — R350 Frequency of micturition: Secondary | ICD-10-CM | POA: Diagnosis not present

## 2019-11-03 LAB — POCT URINALYSIS DIP (MANUAL ENTRY)
Bilirubin, UA: NEGATIVE
Glucose, UA: NEGATIVE mg/dL
Ketones, POC UA: NEGATIVE mg/dL
Nitrite, UA: NEGATIVE
Protein Ur, POC: 100 mg/dL — AB
Spec Grav, UA: 1.025 (ref 1.010–1.025)
Urobilinogen, UA: 0.2 E.U./dL
pH, UA: 6.5 (ref 5.0–8.0)

## 2019-11-03 NOTE — ED Provider Notes (Signed)
[facility]   CC: UTI  SUBJECTIVE:  Mallory English is a 29 y.o. female who complains of urinary frequency, urgency and dysuria for the past 2 days.  Reports that she was treated last week with Keflex over video visit for UTI.  Reports that she is breast-feeding so if there is an infection she would like something safe for that. Patient denies a precipitating event, recent sexual encounter, excessive caffeine intake. Localizes the pain to the lower abdomen. Pain is intermittent and describes it as sharp. Symptoms are made worse with urination.  Admits to similar symptoms in the past.  Denies fever, chills, nausea, vomiting, flank pain, abnormal vaginal discharge or bleeding, hematuria.    LMP: Patient's last menstrual period was 11/03/2019 (exact date).  ROS: As in HPI.  All other pertinent ROS negative.     Past Medical History:  Diagnosis Date  . Asthma    Past Surgical History:  Procedure Laterality Date  . APPENDECTOMY     No Known Allergies No current facility-administered medications on file prior to encounter.   Current Outpatient Medications on File Prior to Encounter  Medication Sig Dispense Refill  . Prenatal Vit-Fe Fumarate-FA (PRENATAL MULTIVITAMIN) TABS tablet Take 1 tablet by mouth daily at 12 noon.    Marland Kitchen albuterol (VENTOLIN HFA) 108 (90 Base) MCG/ACT inhaler Inhale 2 puffs into the lungs every 4 (four) hours as needed for wheezing or shortness of breath. 6.7 g 6  . benzocaine-Menthol (DERMOPLAST) 20-0.5 % AERO Apply 1 application topically as needed for irritation (perineal discomfort). (Patient not taking: Reported on 07/27/2019) 78 g 1  . cephALEXin (KEFLEX) 500 MG capsule Take 500 mg by mouth every 12 (twelve) hours.    . coconut oil OIL Apply 1 application topically as needed. (Patient not taking: Reported on 07/27/2019) 120 mL 0  . docusate sodium (COLACE) 100 MG capsule Take 1 capsule (100 mg total) by mouth 2 (two) times daily. (Patient not taking: Reported  on 07/27/2019) 10 capsule 0  . ferrous sulfate 325 (65 FE) MG tablet Take 1 tablet (325 mg total) by mouth daily with breakfast. (Patient not taking: Reported on 07/27/2019) 30 tablet 3  . ibuprofen (ADVIL) 600 MG tablet Take 1 tablet (600 mg total) by mouth every 6 (six) hours. (Patient not taking: Reported on 07/27/2019) 30 tablet 0  . norethindrone (MICRONOR) 0.35 MG tablet Take 1 tablet (0.35 mg total) by mouth daily. 1 Package 11  . witch hazel-glycerin (TUCKS) pad Apply 1 application topically as needed for hemorrhoids. 40 each 12   Social History   Socioeconomic History  . Marital status: Married    Spouse name: Not on file  . Number of children: Not on file  . Years of education: Not on file  . Highest education level: Not on file  Occupational History  . Not on file  Tobacco Use  . Smoking status: Never Smoker  . Smokeless tobacco: Never Used  Vaping Use  . Vaping Use: Never used  Substance and Sexual Activity  . Alcohol use: Not Currently    Comment: Rarely   . Drug use: No  . Sexual activity: Yes    Birth control/protection: I.U.D.    Comment: undecided   Other Topics Concern  . Not on file  Social History Narrative  . Not on file   Social Determinants of Health   Financial Resource Strain:   . Difficulty of Paying Living Expenses:   Food Insecurity:   . Worried About Radiation protection practitioner  of Food in the Last Year:   . Ran Out of Food in the Last Year:   Transportation Needs:   . Lack of Transportation (Medical):   Marland Kitchen Lack of Transportation (Non-Medical):   Physical Activity:   . Days of Exercise per Week:   . Minutes of Exercise per Session:   Stress:   . Feeling of Stress :   Social Connections:   . Frequency of Communication with Friends and Family:   . Frequency of Social Gatherings with Friends and Family:   . Attends Religious Services:   . Active Member of Clubs or Organizations:   . Attends Banker Meetings:   Marland Kitchen Marital Status:   Intimate  Partner Violence:   . Fear of Current or Ex-Partner:   . Emotionally Abused:   Marland Kitchen Physically Abused:   . Sexually Abused:    Family History  Problem Relation Age of Onset  . Heart defect Mother     OBJECTIVE:  Vitals:   11/03/19 1131  BP: 101/66  Pulse: 84  Resp: 16  Temp: 98.6 F (37 C)  TempSrc: Oral  SpO2: 97%   General appearance: AOx3 in no acute distress HEENT: NCAT.  Oropharynx clear.  Lungs: clear to auscultation bilaterally without adventitious breath sounds Heart: regular rate and rhythm.  Radial pulses 2+ symmetrical bilaterally Abdomen: soft; non-distended; suprapubic tenderness; bowel sounds present; no guarding or rebound tenderness Back: no CVA tenderness Extremities: no edema; symmetrical with no gross deformities Skin: warm and dry Neurologic: Ambulates from chair to exam table without difficulty Psychological: alert and cooperative; normal mood and affect  Labs Reviewed  POCT URINALYSIS DIP (MANUAL ENTRY) - Abnormal; Notable for the following components:      Result Value   Blood, UA large (*)    Protein Ur, POC =100 (*)    Leukocytes, UA Moderate (2+) (*)    All other components within normal limits  URINE CULTURE    ASSESSMENT & PLAN:  1. Dysuria   2. Urinary frequency   3. Urinary urgency     No orders of the defined types were placed in this encounter.   Urine culture sent.   We will call you with abnormal results that need further treatment.   Push fluids and get plenty of rest.   Follow up with PCP if symptoms persists Return here or go to ER if you have any new or worsening symptoms such as fever, worsening abdominal pain, nausea/vomiting, flank pain  Outlined signs and symptoms indicating need for more acute intervention. Patient verbalized understanding. After Visit Summary given.     Moshe Cipro, NP 11/03/19 1244

## 2019-11-03 NOTE — Discharge Instructions (Signed)
You may have a urinary tract infection. We are going to culture your urine and will call you as soon as we have the results.   Drink plenty of water, 8-10 glasses per day.   Follow up with your primary care provider as needed.   Go to the Emergency Department if you experience severe pain, shortness of breath, high fever, or other concerns.   

## 2019-11-03 NOTE — ED Triage Notes (Signed)
Pt presents to UC for urinary frequency, burning with urination, and decreased UOP. Pt denies lower abdominal pain/flank pain. Pt denies fever, chills, body aches, n/v/d. Pt was seen virtually approx 1 week ago received keflex, symptoms still present.   Of note pt breast feeding, needs abx/ meds safe for that.

## 2019-11-04 ENCOUNTER — Other Ambulatory Visit: Payer: Self-pay | Admitting: Certified Nurse Midwife

## 2019-11-04 ENCOUNTER — Ambulatory Visit (INDEPENDENT_AMBULATORY_CARE_PROVIDER_SITE_OTHER): Payer: 59 | Admitting: Certified Nurse Midwife

## 2019-11-04 ENCOUNTER — Encounter: Payer: Self-pay | Admitting: Certified Nurse Midwife

## 2019-11-04 ENCOUNTER — Ambulatory Visit (INDEPENDENT_AMBULATORY_CARE_PROVIDER_SITE_OTHER): Payer: 59

## 2019-11-04 ENCOUNTER — Other Ambulatory Visit: Payer: Self-pay

## 2019-11-04 ENCOUNTER — Telehealth: Payer: Self-pay

## 2019-11-04 VITALS — BP 104/74 | HR 78 | Ht 65.0 in | Wt 257.1 lb

## 2019-11-04 DIAGNOSIS — R58 Hemorrhage, not elsewhere classified: Secondary | ICD-10-CM

## 2019-11-04 DIAGNOSIS — N939 Abnormal uterine and vaginal bleeding, unspecified: Secondary | ICD-10-CM | POA: Diagnosis not present

## 2019-11-04 LAB — URINE CULTURE

## 2019-11-04 NOTE — Progress Notes (Signed)
GYN ENCOUNTER NOTE  Subjective:       Mallory English is a 29 y.o. G81P1001 female is here for gynecologic evaluation of the following issues:  1. 1 episode of heavy bleeding this morning after starting her period. She states she stood up and had a gush of blood on the floor and pass a clot the size of her palm of her had. She then felt light headed. She state she had some pain yesterday and thought she was getting a uti, she went to urgent care and had urine tested. The pain resolved last night. She denies fever or any pain currently and the bleeding has decreased. She denies possibility of pregnancy state she uses condoms and took a pregnancy test the other day that was negative.    Gynecologic History Patient's last menstrual period was 11/03/2019 (exact date). Contraception: condoms Last Pap: 12/02/18 Results were: normal Last mammogram: n/a   Obstetric History OB History  Gravida Para Term Preterm AB Living  2 1 1     1   SAB TAB Ectopic Multiple Live Births          1    # Outcome Date GA Lbr Len/2nd Weight Sex Delivery Anes PTL Lv  2 Gravida           1 Term 08/16/15 [redacted]w[redacted]d  8 lb 14 oz (4.026 kg) M Vag-Spont  N LIV    Past Medical History:  Diagnosis Date  . Asthma     Past Surgical History:  Procedure Laterality Date  . APPENDECTOMY      Current Outpatient Medications on File Prior to Visit  Medication Sig Dispense Refill  . albuterol (VENTOLIN HFA) 108 (90 Base) MCG/ACT inhaler Inhale 2 puffs into the lungs every 4 (four) hours as needed for wheezing or shortness of breath. 6.7 g 6  . benzocaine-Menthol (DERMOPLAST) 20-0.5 % AERO Apply 1 application topically as needed for irritation (perineal discomfort). (Patient not taking: Reported on 07/27/2019) 78 g 1  . cephALEXin (KEFLEX) 500 MG capsule Take 500 mg by mouth every 12 (twelve) hours. (Patient not taking: Reported on 11/04/2019)    . coconut oil OIL Apply 1 application topically as needed. (Patient not taking: Reported on  07/27/2019) 120 mL 0  . docusate sodium (COLACE) 100 MG capsule Take 1 capsule (100 mg total) by mouth 2 (two) times daily. (Patient not taking: Reported on 07/27/2019) 10 capsule 0  . ferrous sulfate 325 (65 FE) MG tablet Take 1 tablet (325 mg total) by mouth daily with breakfast. (Patient not taking: Reported on 07/27/2019) 30 tablet 3  . ibuprofen (ADVIL) 600 MG tablet Take 1 tablet (600 mg total) by mouth every 6 (six) hours. (Patient not taking: Reported on 07/27/2019) 30 tablet 0  . norethindrone (MICRONOR) 0.35 MG tablet Take 1 tablet (0.35 mg total) by mouth daily. (Patient not taking: Reported on 11/04/2019) 1 Package 11  . Prenatal Vit-Fe Fumarate-FA (PRENATAL MULTIVITAMIN) TABS tablet Take 1 tablet by mouth daily at 12 noon. (Patient not taking: Reported on 11/04/2019)    . witch hazel-glycerin (TUCKS) pad Apply 1 application topically as needed for hemorrhoids. (Patient not taking: Reported on 11/04/2019) 40 each 12   No current facility-administered medications on file prior to visit.    No Known Allergies  Social History   Socioeconomic History  . Marital status: Married    Spouse name: Not on file  . Number of children: Not on file  . Years of education: Not on file  .  Highest education level: Not on file  Occupational History  . Not on file  Tobacco Use  . Smoking status: Never Smoker  . Smokeless tobacco: Never Used  Vaping Use  . Vaping Use: Never used  Substance and Sexual Activity  . Alcohol use: Not Currently    Comment: Rarely   . Drug use: No  . Sexual activity: Yes    Birth control/protection: I.U.D.    Comment: undecided   Other Topics Concern  . Not on file  Social History Narrative  . Not on file   Social Determinants of Health   Financial Resource Strain:   . Difficulty of Paying Living Expenses:   Food Insecurity:   . Worried About Programme researcher, broadcasting/film/video in the Last Year:   . Barista in the Last Year:   Transportation Needs:   . Automotive engineer (Medical):   Marland Kitchen Lack of Transportation (Non-Medical):   Physical Activity:   . Days of Exercise per Week:   . Minutes of Exercise per Session:   Stress:   . Feeling of Stress :   Social Connections:   . Frequency of Communication with Friends and Family:   . Frequency of Social Gatherings with Friends and Family:   . Attends Religious Services:   . Active Member of Clubs or Organizations:   . Attends Banker Meetings:   Marland Kitchen Marital Status:   Intimate Partner Violence:   . Fear of Current or Ex-Partner:   . Emotionally Abused:   Marland Kitchen Physically Abused:   . Sexually Abused:     Family History  Problem Relation Age of Onset  . Heart defect Mother     The following portions of the patient's history were reviewed and updated as appropriate: allergies, current medications, past family history, past medical history, past social history, past surgical history and problem list.  Review of Systems Review of Systems - Negative except as mentioned in HPI Review of Systems - General ROS: negative for - chills, fatigue, fever, hot flashes, malaise or night sweats Hematological and Lymphatic ROS: negative for - bleeding problems or swollen lymph nodes Gastrointestinal ROS: negative for - abdominal pain, blood in stools, change in bowel habits and nausea/vomiting Musculoskeletal ROS: negative for - joint pain, muscle pain or muscular weakness Genito-Urinary ROS: negative for -  dysmenorrhea, dyspareunia, dysuria, genital discharge, genital ulcers, hematuria, incontinence, irregular/heavy menses, nocturia or pelvic pain. Positive for change in her bleeding as noted in HPI  Objective:   BP 104/74   Pulse 78   Ht 5\' 5"  (1.651 m)   Wt 257 lb 2 oz (116.6 kg)   LMP 11/03/2019 (Exact Date)   BMI 42.79 kg/m  CONSTITUTIONAL: Well-developed, well-nourished female in no acute distress.  HENT:  Normocephalic, atraumatic.  NECK: Normal range of motion, supple, no masses.   Normal thyroid.  SKIN: Skin is warm and dry. No rash noted. Not diaphoretic. No erythema. No pallor. NEUROLGIC: Alert and oriented to person, place, and time. PSYCHIATRIC: Normal mood and affect. Normal behavior. Normal judgment and thought content. CARDIOVASCULAR:Not Examined RESPIRATORY: Not Examined BREASTS: Not Examined ABDOMEN: Soft, non distended; Non tender.  No Organomegaly. PELVIC:Deferred =. Pelvic u/s completed see below  MUSCULOSKELETAL: Normal range of motion. No tenderness.  No cyanosis, clubbing, or edema.  Patient Name: Mallory English DOB: [birth year] MRN: [id number] ULTRASOUND REPORT  Location: [facility]  Date of Service: 11/04/2019     Indications:AUB Findings:  The uterus is anteverted and measures  8.0 x 4.5 x 5.2 cm. Echo texture is homogenous without evidence of focal masses.  The Endometrium measures 8 mm.  Right Ovary measures 3.8 x 1.7 x 2.2 cm. It is normal in appearance. Left Ovary measures 2.5 x 1.5 x 2.5 cm. It is normal in appearance. Survey of the adnexa demonstrates no adnexal masses. There is  free fluid in the cul de sac.  Impression: 1. There is  free fluid in the cul de sac, otherwise un-remarkable.  Recommendations: 1.Clinical correlation with the patient's History and Physical Exam.   Jenine M. Marciano Sequin    RDMS   Assessment:   Abnormal uterine bleeding   Plan:   Discussed possible change in menstural cycles due to having had a baby. Discussed possible ovarian cyst rupture, and or ectopic , infection as caused of free fluid in cul de sac. Explained that some free fluid is normal. She has not current signs of infection and ectopic is unlikely. Possible ovarian cyst rupture. Explained that since the bleeding is resolving that watchful waiting is warranted. She verbalizes and agrees to plan. Discussed use of motrin and or lysteda for heavy periods. She will let me know if she experiences heavy bleeding with cycle in the future. Follow up  prn.   Doreene Burke, CNM

## 2019-11-04 NOTE — Telephone Encounter (Signed)
Pt states she is 5 months pp. She started her cycle yesterday. This is her 4-5 cycle since having her baby. She stood up this morning and passed a huge clot with blood running down her leg. She states it poured out of her. She is requesting an appt instead of going to the ED.   She stopped the mini pill 2 months ago. Currently using condoms. NO cramps. Bleeding heavy now and feels light headed.   AT wants pt to have a scan first and then she will see her. Spoke with JA- scan at 11:30. Appt with AT at 3:00pm. Informed pt that AT may not  be here see her to see her after scan. She was ok with coming back. Appreciative of working her in.   AT and SS aware via secure chat.

## 2019-11-04 NOTE — Patient Instructions (Signed)
Abnormal Uterine Bleeding °Abnormal uterine bleeding means bleeding more than usual from your uterus. It can include: °· Bleeding between periods. °· Bleeding after sex. °· Bleeding that is heavier than normal. °· Periods that last longer than usual. °· Bleeding after you have stopped having your period (menopause). °There are many problems that may cause this. You should see a doctor for any kind of bleeding that is not normal. Treatment depends on the cause of the bleeding. °Follow these instructions at home: °· Watch your condition for any changes. °· Do not use tampons, douche, or have sex, if your doctor tells you not to. °· Change your pads often. °· Get regular well-woman exams. Make sure they include a pelvic exam and cervical cancer screening. °· Keep all follow-up visits as told by your doctor. This is important. °Contact a doctor if: °· The bleeding lasts more than one week. °· You feel dizzy at times. °· You feel like you are going to throw up (nauseous). °· You throw up. °Get help right away if: °· You pass out. °· You have to change pads every hour. °· You have belly (abdominal) pain. °· You have a fever. °· You get sweaty. °· You get weak. °· You passing large blood clots from your vagina. °Summary °· Abnormal uterine bleeding means bleeding more than usual from your uterus. °· There are many problems that may cause this. You should see a doctor for any kind of bleeding that is not normal. °· Treatment depends on the cause of the bleeding. °This information is not intended to replace advice given to you by your health care provider. Make sure you discuss any questions you have with your health care provider. °Document Revised: 03/27/2016 Document Reviewed: 03/27/2016 °Elsevier Patient Education © 2020 [employer] ° °

## 2020-06-22 ENCOUNTER — Other Ambulatory Visit: Payer: Self-pay

## 2020-06-22 ENCOUNTER — Ambulatory Visit: Payer: 59 | Admitting: Certified Nurse Midwife

## 2020-06-22 ENCOUNTER — Encounter: Payer: Self-pay | Admitting: Certified Nurse Midwife

## 2020-06-22 VITALS — BP 117/79 | HR 74 | Ht 66.0 in | Wt 238.6 lb

## 2020-06-22 DIAGNOSIS — Z30017 Encounter for initial prescription of implantable subdermal contraceptive: Secondary | ICD-10-CM | POA: Diagnosis not present

## 2020-06-22 LAB — POCT URINE PREGNANCY: Preg Test, Ur: NEGATIVE

## 2020-06-22 NOTE — Progress Notes (Signed)
Mallory English is a 30 y.o. year old Caucasian female here for Nexplanon insertion.  No LMP recorded., and her pregnancy test today was negative.  Risks/benefits/side effects of Nexplanon have been discussed and her questions have been answered.  Specifically, a failure rate of 04/998 has been reported, with an increased failure rate if pt takes St. John's Wort and/or antiseizure medicaitons.  Mallory English is aware of the common side effect of irregular bleeding, which the incidence of decreases over time.  BP 117/79   Pulse 74   Ht 5\' 6"  (1.676 m)   Wt 238 lb 9.6 oz (108.2 kg)   BMI 38.51 kg/m   Results for orders placed or performed in visit on 06/22/20 (from the past 24 hour(s))  POCT urine pregnancy   Collection Time: 06/22/20  8:40 AM  Result Value Ref Range   Preg Test, Ur Negative Negative     She is right-handed, so her left arm, approximately 4 inches proximal from the elbow, was cleansed with alcohol and anesthetized with 2cc of 2% Lidocaine.  The area was cleansed again with betadine and the Nexplanon was inserted per manufacturer's recommendations without difficulty.  A steri-strip and pressure bandage were applied.  Pt was instructed to keep the area clean and dry, remove pressure bandage in 24 hours, and keep insertion site covered with the steri-strip for 3-5 days.  Back up contraception was recommended for 2 weeks.  She was given a card indicating date Nexplanon was inserted and date it needs to be removed. Follow-up PRN problems.  08/22/20 Mallory English,CNM

## 2020-06-22 NOTE — Patient Instructions (Signed)
Nexplanon Instructions After Insertion  Keep bandage clean and dry for 24 hours  May use ice/Tylenol/Ibuprofen for soreness or pain  If you develop fever, drainage or increased warmth from incision site-contact office immediately   

## 2020-09-16 IMAGING — US US EXTREM LOW VENOUS
1 series · 13 of 24 positions shown · non-contrast
Comparison: None.

CLINICAL DATA: Elevated D-dimer, chest pain and palpitations



[Series 1: us extrem low venous · 13 of 59 slices shown]
[im 1/59]
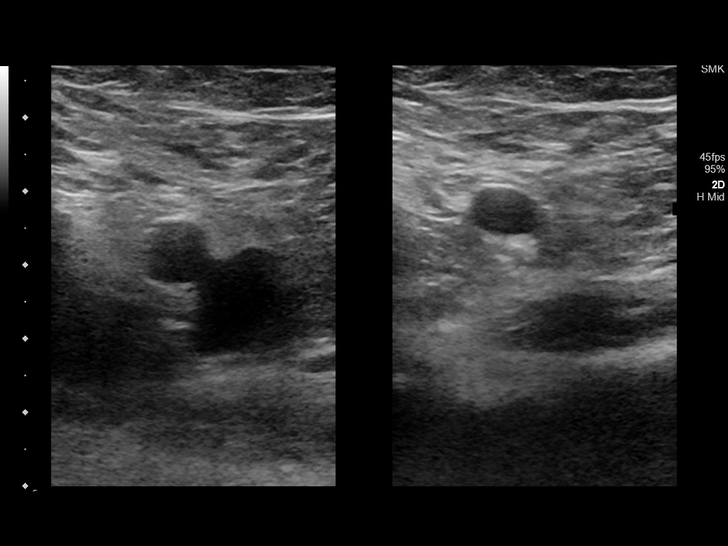
[im 6/59]
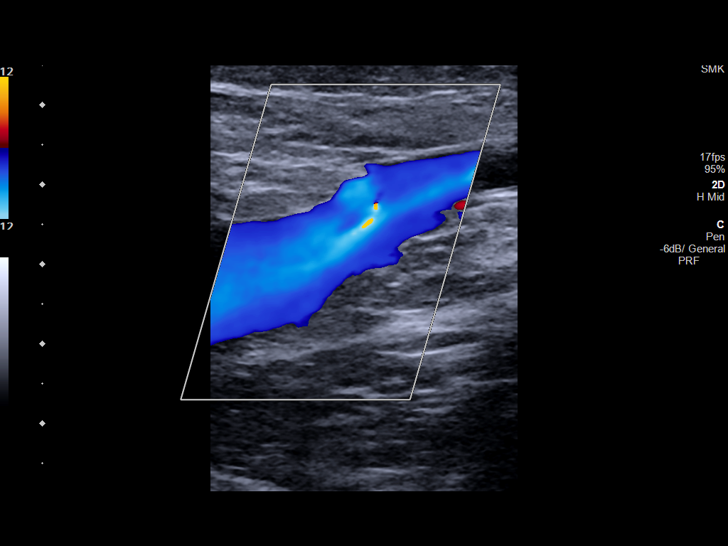
[im 11/59]
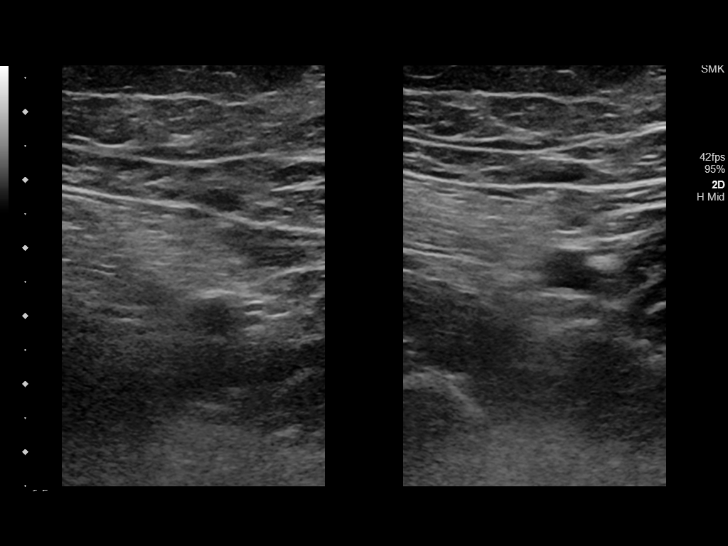
[im 16/59]
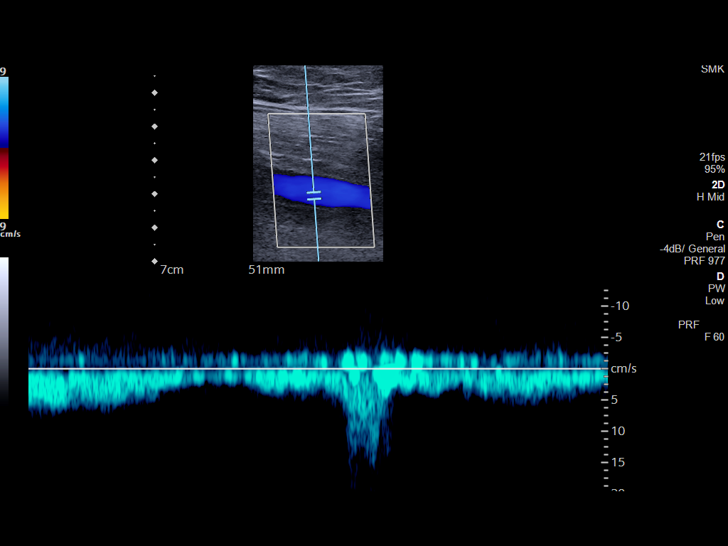
[im 21/59]
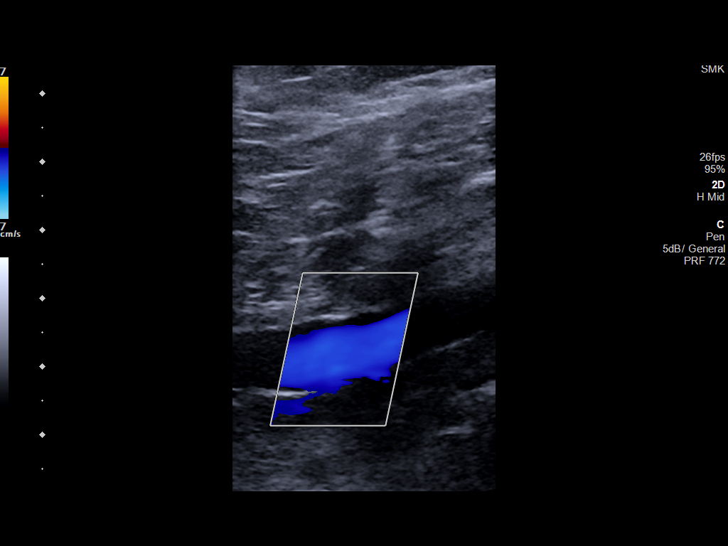
[im 26/59]
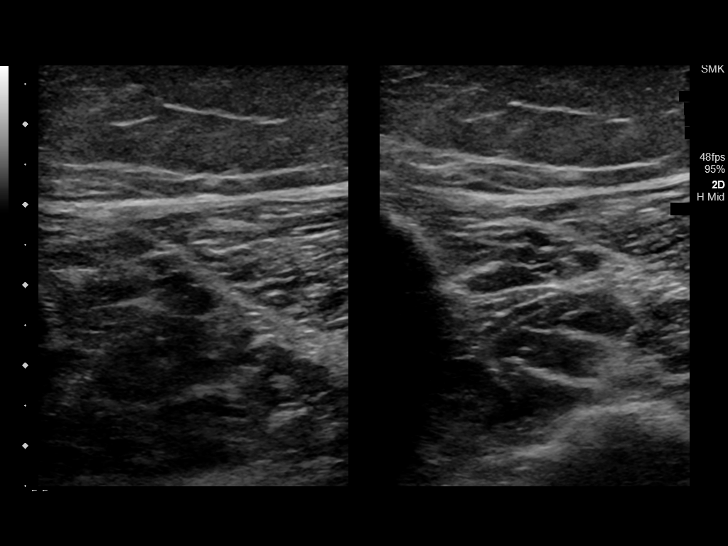
[im 31/59]
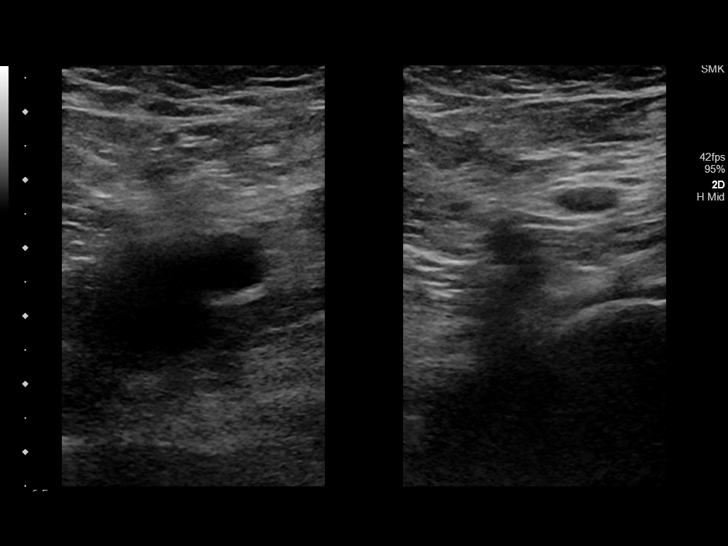
[im 33/59]
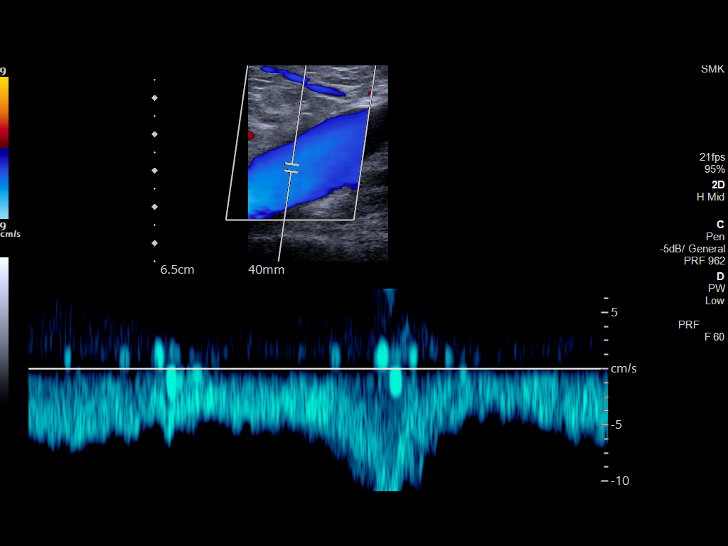
[im 38/59]
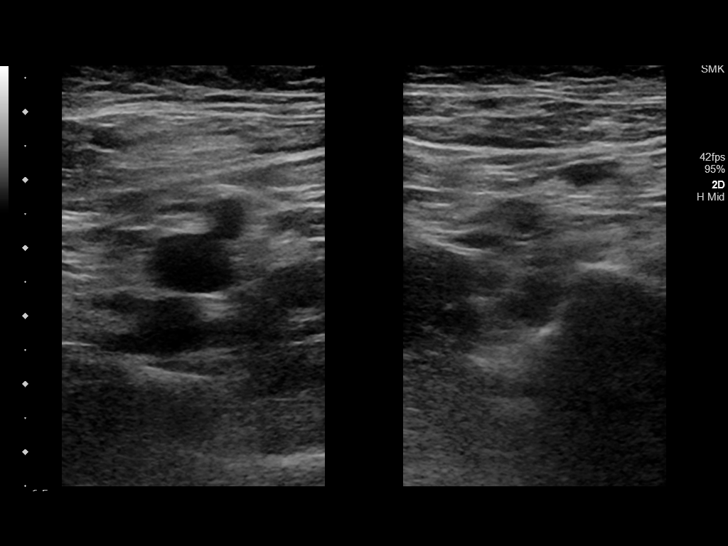
[im 43/59]
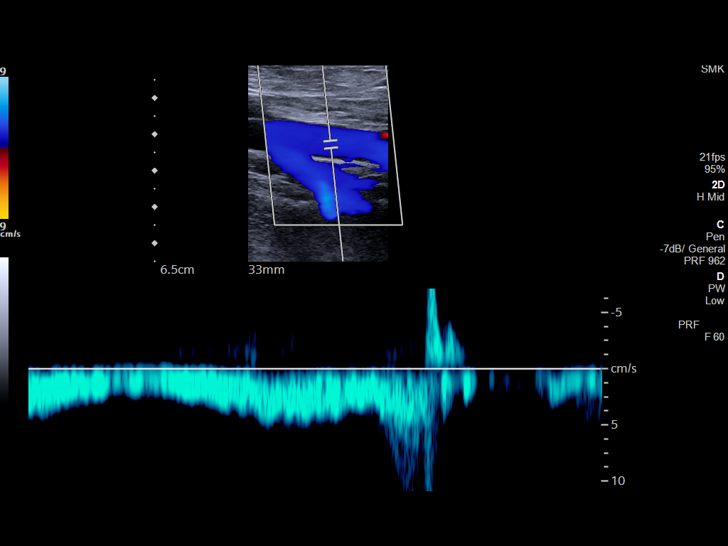
[im 48/59]
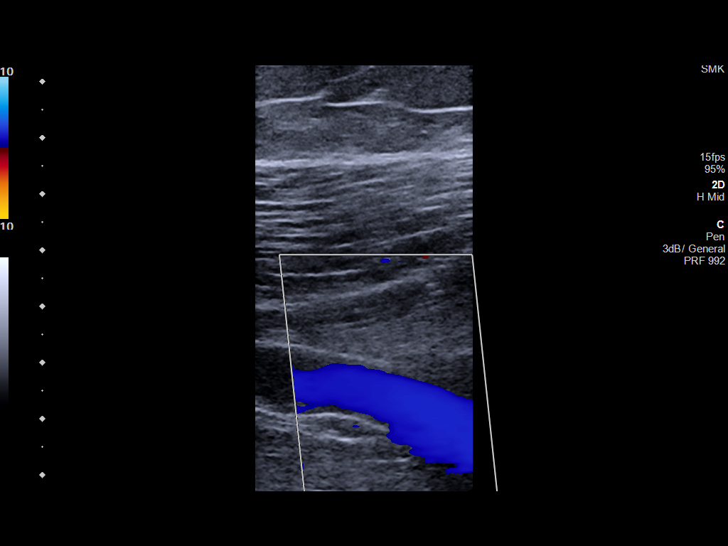
[im 53/59]
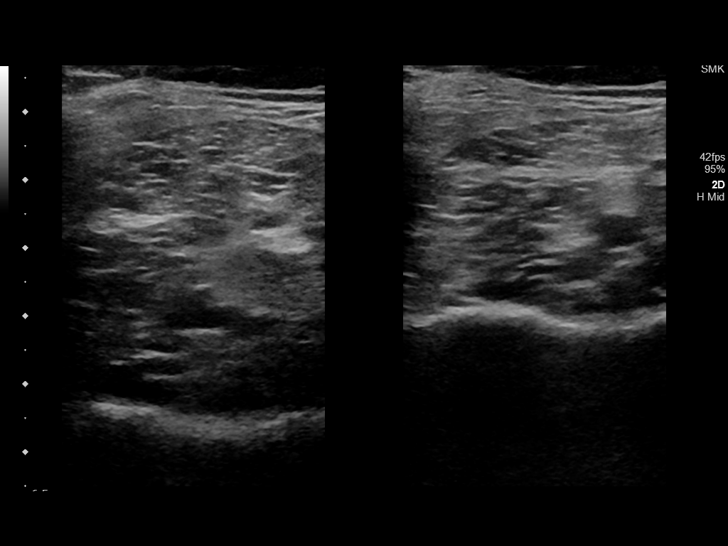
[im 59/59]
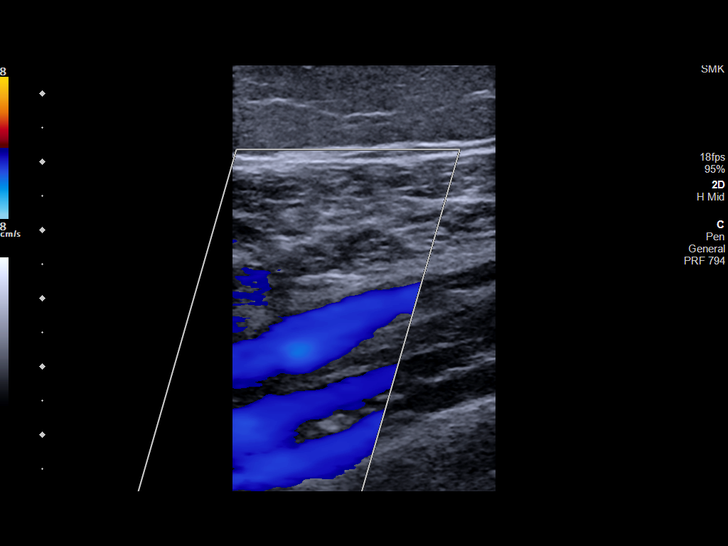

[13 of 24 positions shown; findings below may reference images not displayed]

FINDINGS: RIGHT LOWER EXTREMITY

Common Femoral Vein: No evidence of thrombus. Normal
compressibility, respiratory phasicity and response to augmentation.

Saphenofemoral Junction: No evidence of thrombus. Normal
compressibility and flow on color Doppler imaging.

Profunda Femoral Vein: No evidence of thrombus. Normal
compressibility and flow on color Doppler imaging.

Femoral Vein: No evidence of thrombus. Normal compressibility,
respiratory phasicity and response to augmentation.

Popliteal Vein: No evidence of thrombus. Normal compressibility,
respiratory phasicity and response to augmentation.

Calf Veins: No evidence of thrombus. Normal compressibility and flow
on color Doppler imaging.

LEFT LOWER EXTREMITY

Common Femoral Vein: No evidence of thrombus. Normal
compressibility, respiratory phasicity and response to augmentation.

Saphenofemoral Junction: No evidence of thrombus. Normal
compressibility and flow on color Doppler imaging.

Profunda Femoral Vein: No evidence of thrombus. Normal
compressibility and flow on color Doppler imaging.

Femoral Vein: No evidence of thrombus. Normal compressibility,
respiratory phasicity and response to augmentation.

Popliteal Vein: No evidence of thrombus. Normal compressibility,
respiratory phasicity and response to augmentation.

Calf Veins: No evidence of thrombus. Normal compressibility and flow
on color Doppler imaging.
IMPRESSION: No evidence of deep venous thrombosis in either lower extremity.

## 2021-08-29 ENCOUNTER — Ambulatory Visit
Admission: EM | Admit: 2021-08-29 | Discharge: 2021-08-29 | Disposition: A | Discharge status: Home / Self Care | Payer: 59 | Attending: Emergency Medicine | Admitting: Emergency Medicine

## 2021-08-29 ENCOUNTER — Ambulatory Visit (INDEPENDENT_AMBULATORY_CARE_PROVIDER_SITE_OTHER): Payer: 59

## 2021-08-29 DIAGNOSIS — M7989 Other specified soft tissue disorders: Secondary | ICD-10-CM | POA: Diagnosis not present

## 2021-08-29 DIAGNOSIS — M79672 Pain in left foot: Secondary | ICD-10-CM

## 2021-08-29 NOTE — Discharge Instructions (Addendum)
Follow up with an orthopedist.

## 2021-08-29 NOTE — ED Triage Notes (Addendum)
Patient presents to Urgent Care with complaints of a knot on top of her left foot since today. No pain with palpation only with ambulation. She reports some pins and needle sensation.  ? ?Denies foot injury. ?

## 2021-08-29 NOTE — ED Provider Notes (Signed)
?[facility] ? ? ? ?CSN: [id number] ?Arrival date & time: 08/29/21  1440 ? ? ?  ? ?History   ?Chief Complaint ?Chief Complaint  ?Patient presents with  ? Cyst  ?  On left foot  ? ? ?HPI ?Mallory English is a 31 y.o. female.  Patient presents with a painful swollen area on the top of her left foot today.  No known injury.  It hurts only with ambulation.  It is not tender to touch.  She reports intermittent tingling sensation.  No wounds, redness, bruising, numbness, weakness, or other symptoms.  No treatments at home.  LMP current.  Her medical history includes asthma, morbid obesity, anxiety.   ? ?The history is provided by the patient and medical records.  ? ?Past Medical History:  ?Diagnosis Date  ? Asthma   ? ? ?Patient Active Problem List  ? Diagnosis Date Noted  ? Labor and delivery, indication for care 06/11/2019  ? Mild intermittent asthma without complication 02/13/2018  ? Obesity, Class III, BMI 40-49.9 (morbid obesity) (HCC) 02/13/2018  ? Anxiety 02/13/2018  ? ? ?Past Surgical History:  ?Procedure Laterality Date  ? APPENDECTOMY    ? ? ?OB History   ? ? Gravida  ?2  ? Para  ?1  ? Term  ?1  ? Preterm  ?   ? AB  ?   ? Living  ?1  ?  ? ? SAB  ?   ? IAB  ?   ? Ectopic  ?   ? Multiple  ?   ? Live Births  ?1  ?   ?  ?  ? ? ? ?Home Medications   ? ?Prior to Admission medications   ?Medication Sig Start Date End Date Taking? Authorizing Provider  ?albuterol (VENTOLIN HFA) 108 (90 Base) MCG/ACT inhaler Inhale 2 puffs into the lungs every 4 (four) hours as needed for wheezing or shortness of breath. 05/20/19   Doreene Burke, CNM  ? ? ?Family History ?Family History  ?Problem Relation Age of Onset  ? Heart defect Mother   ? ? ?Social History ?Social History  ? ?Tobacco Use  ? Smoking status: Never  ? Smokeless tobacco: Never  ?Vaping Use  ? Vaping Use: Never used  ?Substance Use Topics  ? Alcohol use: Not Currently  ?  Comment: Rarely   ? Drug use: No  ? ? ? ?Allergies   ?Patient has no known  allergies. ? ? ?Review of Systems ?Review of Systems  ?Musculoskeletal:  Positive for arthralgias and joint swelling. Negative for gait problem.  ?Skin:  Negative for color change, rash and wound.  ?Neurological:  Negative for weakness and numbness.  ?All other systems reviewed and are negative. ? ? ?Physical Exam ?Triage Vital Signs ?ED Triage Vitals  ?Enc Vitals Group  ?   BP   ?   Pulse   ?   Resp   ?   Temp   ?   Temp src   ?   SpO2   ?   Weight   ?   Height   ?   Head Circumference   ?   Peak Flow   ?   Pain Score   ?   Pain Loc   ?   Pain Edu?   ?   Excl. in [place]?   ? ?No data found. ? ?Updated Vital Signs ?BP 113/85   Pulse 79   Temp 97.9 ?F (36.6 ?C)   Resp 18  LMP 08/29/2021   SpO2 98%  ? ?Visual Acuity ?Right Eye Distance:   ?Left Eye Distance:   ?Bilateral Distance:   ? ?Right Eye Near:   ?Left Eye Near:    ?Bilateral Near:    ? ?Physical Exam ?Vitals and nursing note reviewed.  ?Constitutional:   ?   General: She is not in acute distress. ?   Appearance: She is well-developed. She is obese. She is not ill-appearing.  ?HENT:  ?   Mouth/Throat:  ?   Mouth: Mucous membranes are moist.  ?Eyes:  ?   Conjunctiva/sclera: Conjunctivae normal.  ?Cardiovascular:  ?   Rate and Rhythm: Normal rate and regular rhythm.  ?Pulmonary:  ?   Effort: Pulmonary effort is normal. No respiratory distress.  ?Musculoskeletal:     ?   General: Swelling present. No tenderness or deformity. Normal range of motion.  ?   Cervical back: Neck supple.  ?   Comments: Soft nontender cyst on dorsum of left foot. No erythema, ecchymosis, wounds.  ?LLE 2+ pulses, sensation intact, FROM, strength 5/5.   ?Skin: ?   General: Skin is warm and dry.  ?   Capillary Refill: Capillary refill takes less than 2 seconds.  ?   Findings: No bruising, erythema, lesion or rash.  ?Neurological:  ?   General: No focal deficit present.  ?   Mental Status: She is alert and oriented to person, place, and time.  ?   Sensory: No sensory deficit.  ?   Motor: No  weakness.  ?   Gait: Gait normal.  ?Psychiatric:     ?   Mood and Affect: Mood normal.     ?   Behavior: Behavior normal.  ? ? ? ?UC Treatments / Results  ?Labs ?(all labs ordered are listed, but only abnormal results are displayed) ?Labs Reviewed - No data to display ? ?EKG ? ? ?Radiology ?DG Foot Complete Left ? ?Result Date: 08/29/2021 ?CLINICAL DATA:  Pain  with ambulation EXAM: LEFT FOOT - COMPLETE 3+ VIEW COMPARISON:  None Available. FINDINGS: There is no evidence of fracture or dislocation. There is no evidence of arthropathy or other focal bone abnormality. Dorsal soft tissue swelling over the midfoot. IMPRESSION: Dorsal soft tissue swelling without bone abnormality. Electronically Signed   By: Corlis Leak M.D.   On: 08/29/2021 15:30   ? ?Procedures ?Procedures (including critical care time) ? ?Medications Ordered in UC ?Medications - No data to display ? ?Initial Impression / Assessment and Plan / UC Course  ?I have reviewed the triage vital signs and the nursing notes. ? ?Pertinent labs & imaging results that were available during my care of the patient were reviewed by me and considered in my medical decision making (see chart for details). ? ?  ?Left foot pain and swelling.  X-ray shows soft tissue swelling but no bony abnormality.  This appears to be a cyst.  Instructed patient to follow-up with orthopedist.  Contact information for [facility] provided as they are on-call today.  Tylenol or ibuprofen as needed for discomfort.  Rest and elevate foot.  Patient agrees to plan of care. ? ?Final Clinical Impressions(s) / UC Diagnoses  ? ?Final diagnoses:  ?Left foot pain  ?Swelling of left foot  ? ? ? ?Discharge Instructions   ? ?  ?Follow up with an orthopedist.  ? ? ? ? ? ? ?ED Prescriptions   ?None ?  ? ?PDMP not reviewed this encounter. ?  ?Mickie Bail, NP ?  08/29/21 1549 ? ?

## 2022-01-11 ENCOUNTER — Ambulatory Visit
Admission: EM | Admit: 2022-01-11 | Discharge: 2022-01-11 | Disposition: A | Discharge status: Home / Self Care | Payer: 59 | Attending: Orthopedic Surgery | Admitting: Orthopedic Surgery

## 2022-01-11 DIAGNOSIS — N3001 Acute cystitis with hematuria: Secondary | ICD-10-CM | POA: Diagnosis not present

## 2022-01-11 LAB — POCT URINALYSIS DIP (MANUAL ENTRY)
Bilirubin, UA: NEGATIVE
Glucose, UA: NEGATIVE mg/dL
Ketones, POC UA: NEGATIVE mg/dL
Nitrite, UA: NEGATIVE
Protein Ur, POC: 30 mg/dL — AB
Spec Grav, UA: 1.01 (ref 1.010–1.025)
Urobilinogen, UA: 0.2 E.U./dL
pH, UA: 7 (ref 5.0–8.0)

## 2022-01-11 LAB — POCT URINE PREGNANCY: Preg Test, Ur: NEGATIVE

## 2022-01-11 MED ORDER — NITROFURANTOIN MONOHYD MACRO 100 MG PO CAPS: 100.0000 mg | ORAL_CAPSULE | Freq: Two times a day (BID) | ORAL | 0 refills | Status: AC

## 2022-01-11 NOTE — Discharge Instructions (Addendum)
Please take antibiotics as prescribed.  Make sure you are drinking lots of fluids.  Return to the urgent care for any increasing pain, nausea, fevers, back pain worsening symptoms or urgent changes in health

## 2022-01-11 NOTE — ED Triage Notes (Signed)
Patient to Urgent Care with complaints of dysuria and urinary frequency that started yesterday. Denies any known fevers.

## 2022-01-11 NOTE — ED Provider Notes (Signed)
[name]    CSN: [id number] Arrival date & time: 01/11/22  7425      History   Chief Complaint Chief Complaint  Patient presents with   Dysuria    HPI Mallory English is a 31 y.o. female.  Presents to the urgent care for evaluation of dysuria.  She has had burning and pain with urination as well as some pressure over the last few days.  She denies any back pain nausea vomiting or fevers.  She is on her menstrual period  HPI  Past Medical History:  Diagnosis Date   Asthma     Patient Active Problem List   Diagnosis Date Noted   Labor and delivery, indication for care 06/11/2019   Mild intermittent asthma without complication 95/63/8756   Obesity, Class III, BMI 40-49.9 (morbid obesity) ([place]) 02/13/2018   Anxiety 02/13/2018    Past Surgical History:  Procedure Laterality Date   APPENDECTOMY      OB History     Gravida  2   Para  1   Term  1   Preterm      AB      Living  1      SAB      IAB      Ectopic      Multiple      Live Births  1            Home Medications    Prior to Admission medications   Medication Sig Start Date End Date Taking? Authorizing Provider  nitrofurantoin, macrocrystal-monohydrate, (MACROBID) 100 MG capsule Take 1 capsule (100 mg total) by mouth 2 (two) times daily. 01/11/22  Yes Duanne Guess, PA-C  albuterol (VENTOLIN HFA) 108 (90 Base) MCG/ACT inhaler Inhale 2 puffs into the lungs every 4 (four) hours as needed for wheezing or shortness of breath. 05/20/19   Philip Aspen, CNM    Family History Family History  Problem Relation Age of Onset   Heart defect Mother     Social History Social History   Tobacco Use   Smoking status: Never   Smokeless tobacco: Never  Vaping Use   Vaping Use: Never used  Substance Use Topics   Alcohol use: Not Currently    Comment: Rarely    Drug use: No     Allergies   Patient has no known allergies.   Review of Systems Review of Systems   Physical  Exam Triage Vital Signs ED Triage Vitals  Enc Vitals Group     BP 01/11/22 1010 119/80     Pulse Rate 01/11/22 1010 98     Resp 01/11/22 1010 18     Temp 01/11/22 1010 97.9 F (36.6 C)     Temp src --      SpO2 01/11/22 1010 97 %     Weight 01/11/22 1015 230 lb (104.3 kg)     Height 01/11/22 1015 5\' 6"  (1.676 m)     Head Circumference --      Peak Flow --      Pain Score 01/11/22 1014 2     Pain Loc --      Pain Edu? --      Excl. in [place]? --    No data found.  Updated Vital Signs BP 119/80   Pulse 98   Temp 97.9 F (36.6 C)   Resp 18   Ht 5\' 6"  (1.676 m)   Wt 230 lb (104.3 kg)   LMP 12/28/2021  SpO2 97%   BMI 37.12 kg/m   Visual Acuity Right Eye Distance:   Left Eye Distance:   Bilateral Distance:    Right Eye Near:   Left Eye Near:    Bilateral Near:     Physical Exam Constitutional:      Appearance: She is well-developed.  HENT:     Head: Normocephalic and atraumatic.  Eyes:     Conjunctiva/sclera: Conjunctivae normal.  Cardiovascular:     Rate and Rhythm: Normal rate.  Pulmonary:     Effort: Pulmonary effort is normal. No respiratory distress.  Abdominal:     General: Abdomen is flat. There is no distension.     Palpations: Abdomen is soft.     Tenderness: There is no abdominal tenderness. There is no guarding.  Musculoskeletal:        General: Normal range of motion.     Cervical back: Normal range of motion.  Skin:    General: Skin is warm.     Findings: No rash.  Neurological:     Mental Status: She is alert and oriented to person, place, and time.  Psychiatric:        Behavior: Behavior normal.        Thought Content: Thought content normal.      UC Treatments / Results  Labs (all labs ordered are listed, but only abnormal results are displayed) Labs Reviewed  POCT URINALYSIS DIP (MANUAL ENTRY) - Abnormal; Notable for the following components:      Result Value   Clarity, UA cloudy (*)    Blood, UA large (*)    Protein Ur, POC  =30 (*)    Leukocytes, UA Moderate (2+) (*)    All other components within normal limits  POCT URINE PREGNANCY    EKG   Radiology No results found.  Procedures Procedures (including critical care time)  Medications Ordered in UC Medications - No data to display  Initial Impression / Assessment and Plan / UC Course  I have reviewed the triage vital signs and the nursing notes.  Pertinent labs & imaging results that were available during my care of the patient were reviewed by me and considered in my medical decision making (see chart for details).     32 year old female with symptoms of a UTI.  Urinalysis consistent with UTI.  She is placed on Macrobid.  She has no back pain, fevers, nausea or vomiting.  She is educated on signs and symptoms return to the urgent care for. Final Clinical Impressions(s) / UC Diagnoses   Final diagnoses:  Acute cystitis with hematuria     Discharge Instructions      Please take antibiotics as prescribed.  Make sure you are drinking lots of fluids.  Return to the urgent care for any increasing pain, nausea, fevers, back pain worsening symptoms or urgent changes in health   ED Prescriptions     Medication Sig Dispense Auth. Provider   nitrofurantoin, macrocrystal-monohydrate, (MACROBID) 100 MG capsule Take 1 capsule (100 mg total) by mouth 2 (two) times daily. 10 capsule Duanne Guess, PA-C      PDMP not reviewed this encounter.   Duanne Guess, PA-C 01/11/22 1041

## 2022-01-30 ENCOUNTER — Encounter: Payer: Self-pay | Admitting: Certified Nurse Midwife

## 2022-10-24 ENCOUNTER — Other Ambulatory Visit: Payer: Self-pay | Admitting: Oncology

## 2022-10-24 DIAGNOSIS — Z006 Encounter for examination for normal comparison and control in clinical research program: Secondary | ICD-10-CM

## 2023-02-15 IMAGING — DX DG FOOT COMPLETE 3+V*L*
3 series · 3 of 3 positions shown · non-contrast
Comparison: None Available.

CLINICAL DATA: Pain  with ambulation

EXAM:
LEFT FOOT - COMPLETE 3+ VIEW

[foot ap]
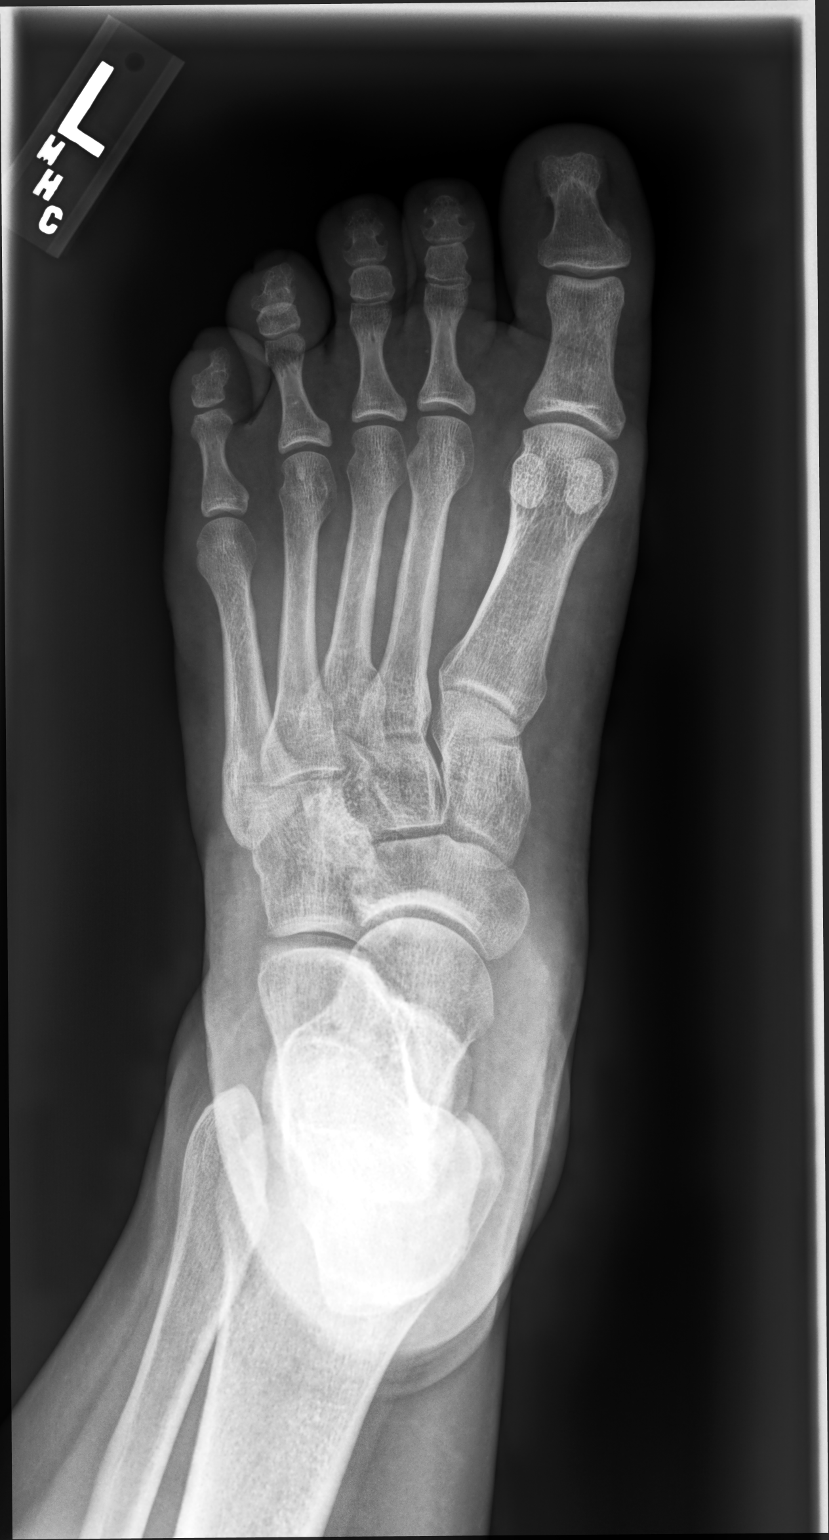

[foot mlo]
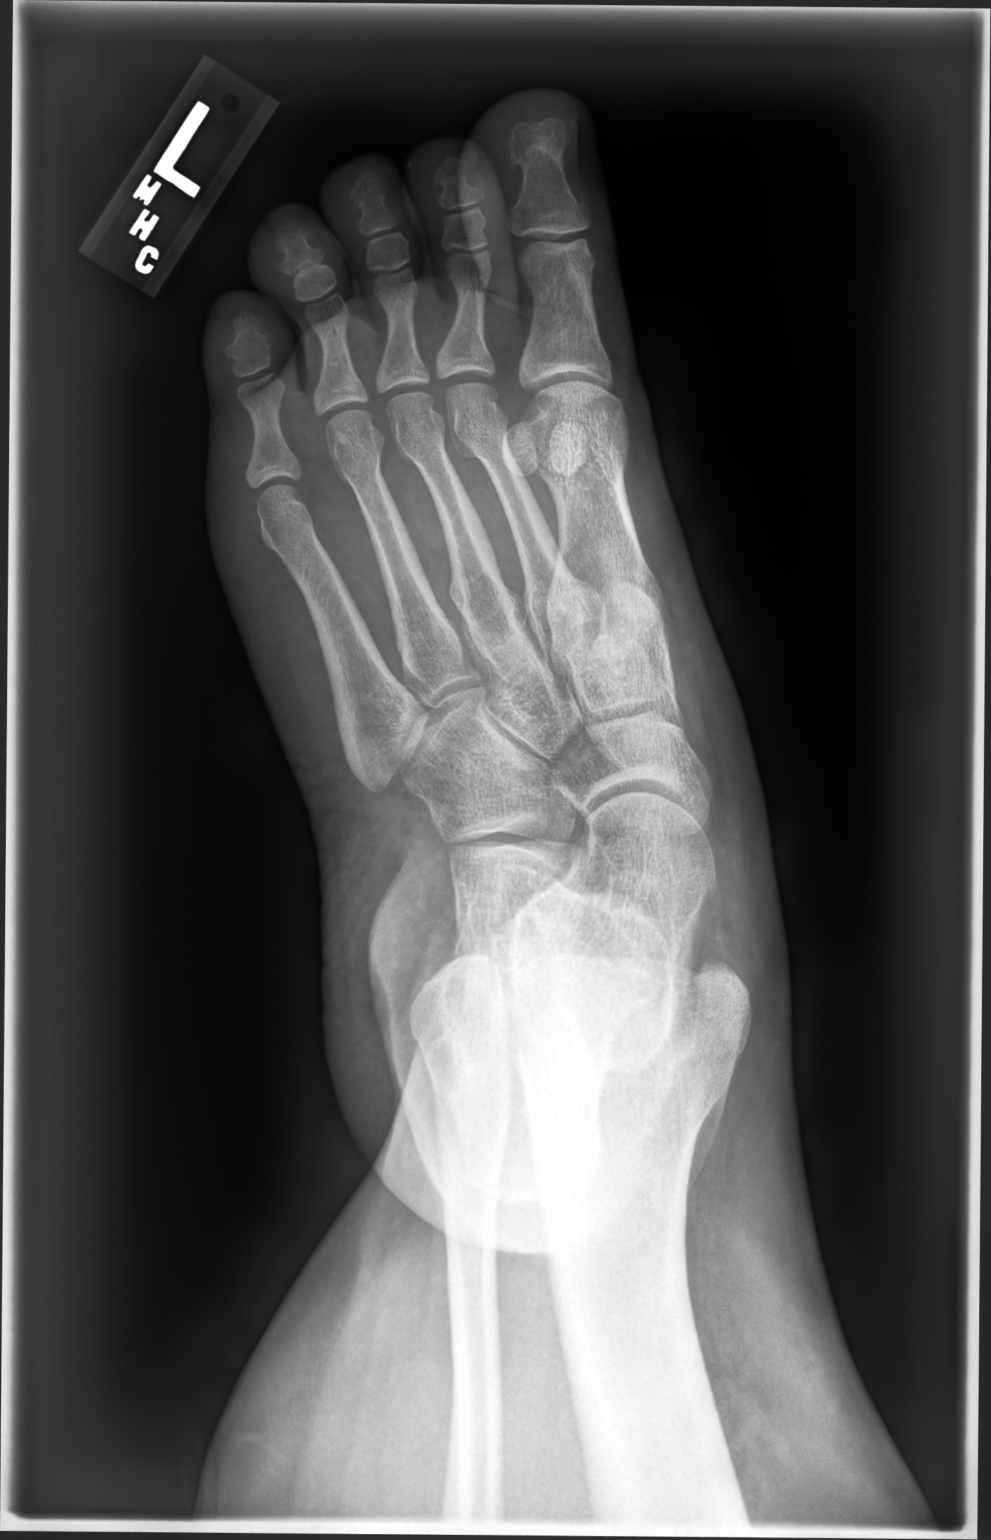

[foot lat]
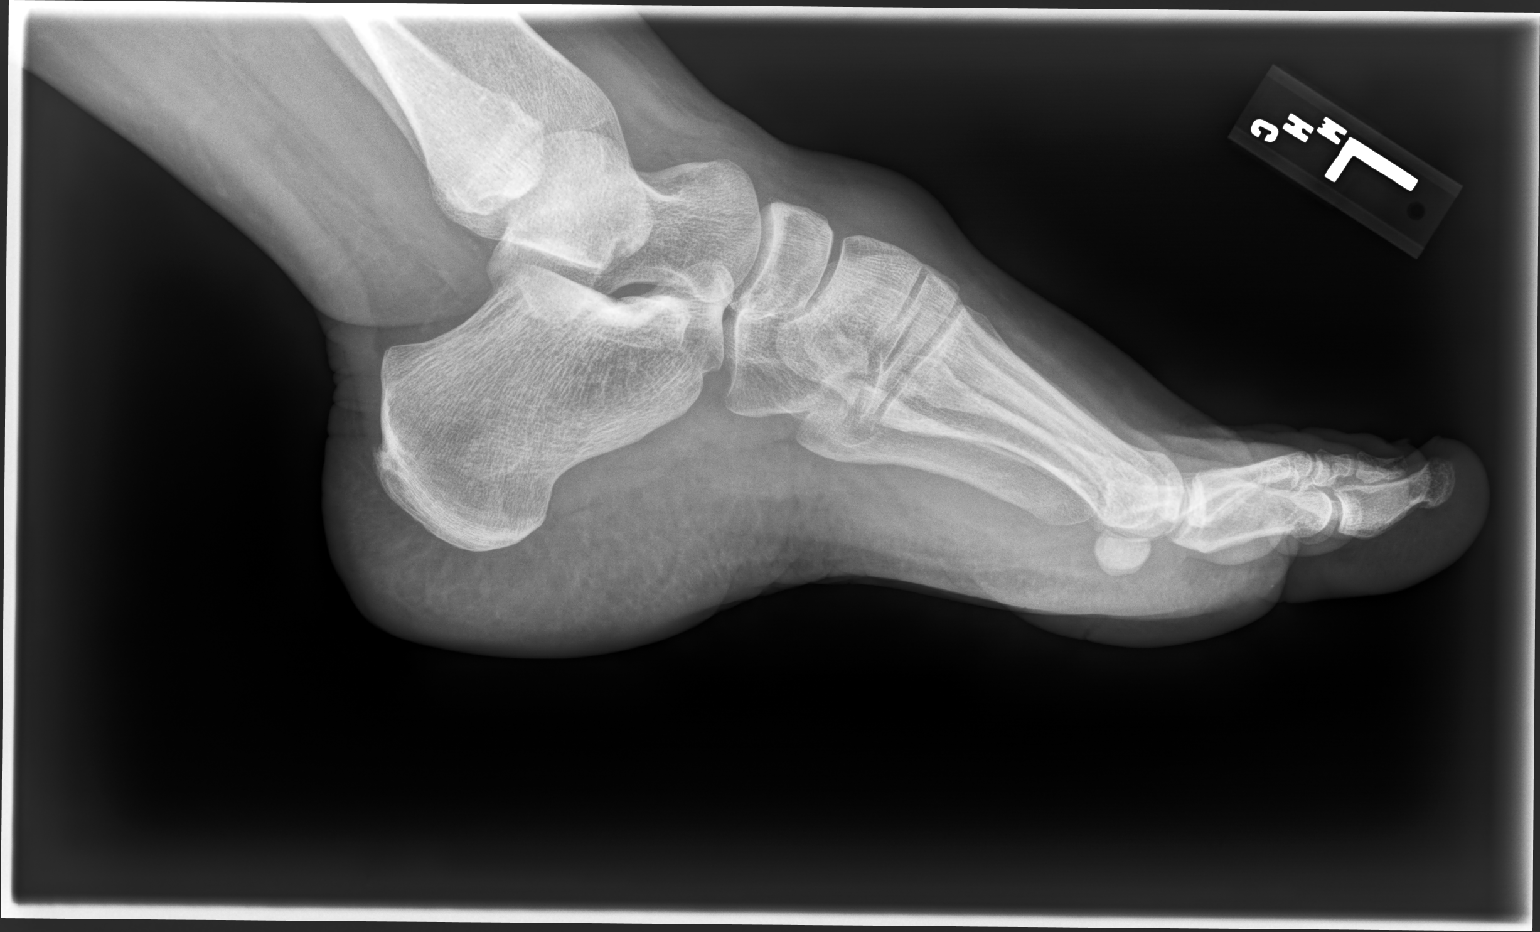

[3 of 3 positions shown; findings below may reference images not displayed]

FINDINGS: There is no evidence of fracture or dislocation. There is no
evidence of arthropathy or other focal bone abnormality. Dorsal soft
tissue swelling over the midfoot.
IMPRESSION: Dorsal soft tissue swelling without bone abnormality.

## 2024-01-24 ENCOUNTER — Other Ambulatory Visit: Payer: Self-pay | Admitting: Genetic Counselor

## 2024-01-24 DIAGNOSIS — Z006 Encounter for examination for normal comparison and control in clinical research program: Secondary | ICD-10-CM
